# Patient Record
Sex: Female | Born: 1945 | Race: White | Hispanic: No | Smoking: Former smoker
Health system: Southern US, Community
[De-identification: ages and names within clinical notes are randomized; demographics above are authoritative.]

## PROBLEM LIST (undated history)

## (undated) DIAGNOSIS — I10 Essential (primary) hypertension: Secondary | ICD-10-CM

## (undated) DIAGNOSIS — C4492 Squamous cell carcinoma of skin, unspecified: Secondary | ICD-10-CM

## (undated) DIAGNOSIS — E785 Hyperlipidemia, unspecified: Secondary | ICD-10-CM

## (undated) DIAGNOSIS — J449 Chronic obstructive pulmonary disease, unspecified: Secondary | ICD-10-CM

## (undated) DIAGNOSIS — M1990 Unspecified osteoarthritis, unspecified site: Secondary | ICD-10-CM

## (undated) HISTORY — PX: APPENDECTOMY: SHX54

## (undated) HISTORY — PX: OTHER SURGICAL HISTORY: SHX169

## (undated) HISTORY — PX: CHOLECYSTECTOMY: SHX55

## (undated) HISTORY — DX: Hyperlipidemia, unspecified: E78.5

## (undated) HISTORY — PX: TONSILLECTOMY: SUR1361

## (undated) HISTORY — DX: Unspecified osteoarthritis, unspecified site: M19.90

## (undated) HISTORY — DX: Chronic obstructive pulmonary disease, unspecified: J44.9

## (undated) HISTORY — PX: ABDOMINAL HYSTERECTOMY: SHX81

---

## 2004-06-16 ENCOUNTER — Ambulatory Visit: Payer: Self-pay | Admitting: Unknown Physician Specialty

## 2004-10-20 ENCOUNTER — Ambulatory Visit: Payer: Self-pay | Admitting: Unknown Physician Specialty

## 2009-01-08 ENCOUNTER — Other Ambulatory Visit: Payer: Self-pay

## 2009-06-23 ENCOUNTER — Other Ambulatory Visit: Payer: Self-pay

## 2009-08-24 ENCOUNTER — Inpatient Hospital Stay: Payer: Self-pay | Admitting: Internal Medicine

## 2010-01-27 ENCOUNTER — Ambulatory Visit: Payer: Self-pay | Admitting: Urology

## 2010-04-14 ENCOUNTER — Ambulatory Visit: Payer: Self-pay | Admitting: Unknown Physician Specialty

## 2010-05-27 ENCOUNTER — Ambulatory Visit: Payer: Self-pay | Admitting: Gastroenterology

## 2010-06-02 ENCOUNTER — Inpatient Hospital Stay: Payer: Self-pay | Admitting: Internal Medicine

## 2011-04-15 ENCOUNTER — Ambulatory Visit: Payer: Self-pay | Admitting: Internal Medicine

## 2011-09-22 ENCOUNTER — Ambulatory Visit: Payer: Self-pay | Admitting: Internal Medicine

## 2012-06-21 ENCOUNTER — Ambulatory Visit: Payer: Self-pay | Admitting: Internal Medicine

## 2012-07-05 ENCOUNTER — Ambulatory Visit: Payer: Self-pay | Admitting: Internal Medicine

## 2013-07-31 ENCOUNTER — Ambulatory Visit: Payer: Self-pay | Admitting: Internal Medicine

## 2013-09-07 DIAGNOSIS — R739 Hyperglycemia, unspecified: Secondary | ICD-10-CM | POA: Insufficient documentation

## 2013-10-22 DIAGNOSIS — Z8709 Personal history of other diseases of the respiratory system: Secondary | ICD-10-CM | POA: Insufficient documentation

## 2013-10-22 DIAGNOSIS — I10 Essential (primary) hypertension: Secondary | ICD-10-CM | POA: Insufficient documentation

## 2013-10-22 DIAGNOSIS — I480 Paroxysmal atrial fibrillation: Secondary | ICD-10-CM | POA: Insufficient documentation

## 2014-07-01 DIAGNOSIS — R0602 Shortness of breath: Secondary | ICD-10-CM | POA: Insufficient documentation

## 2014-10-02 ENCOUNTER — Other Ambulatory Visit: Payer: Self-pay | Admitting: Internal Medicine

## 2014-10-02 DIAGNOSIS — Z1231 Encounter for screening mammogram for malignant neoplasm of breast: Secondary | ICD-10-CM

## 2014-10-11 ENCOUNTER — Other Ambulatory Visit: Payer: Self-pay | Admitting: Internal Medicine

## 2014-10-11 ENCOUNTER — Ambulatory Visit
Admission: RE | Admit: 2014-10-11 | Discharge: 2014-10-11 | Disposition: A | Discharge status: Home / Self Care | Payer: Medicare Other | Source: Ambulatory Visit | Attending: Internal Medicine | Admitting: Internal Medicine

## 2014-10-11 DIAGNOSIS — Z1231 Encounter for screening mammogram for malignant neoplasm of breast: Secondary | ICD-10-CM

## 2014-12-13 ENCOUNTER — Other Ambulatory Visit: Payer: Self-pay | Admitting: Internal Medicine

## 2014-12-13 DIAGNOSIS — R278 Other lack of coordination: Secondary | ICD-10-CM

## 2014-12-13 DIAGNOSIS — R296 Repeated falls: Secondary | ICD-10-CM

## 2014-12-13 DIAGNOSIS — R279 Unspecified lack of coordination: Secondary | ICD-10-CM

## 2014-12-13 DIAGNOSIS — R270 Ataxia, unspecified: Secondary | ICD-10-CM

## 2014-12-27 ENCOUNTER — Ambulatory Visit
Admission: RE | Admit: 2014-12-27 | Discharge: 2014-12-27 | Disposition: A | Discharge status: Home / Self Care | Payer: Medicare Other | Source: Ambulatory Visit | Attending: Internal Medicine | Admitting: Internal Medicine

## 2014-12-27 DIAGNOSIS — R296 Repeated falls: Secondary | ICD-10-CM | POA: Diagnosis present

## 2014-12-27 DIAGNOSIS — R279 Unspecified lack of coordination: Secondary | ICD-10-CM

## 2014-12-27 DIAGNOSIS — R270 Ataxia, unspecified: Secondary | ICD-10-CM | POA: Insufficient documentation

## 2014-12-27 DIAGNOSIS — R278 Other lack of coordination: Secondary | ICD-10-CM

## 2015-02-25 ENCOUNTER — Other Ambulatory Visit: Payer: Self-pay | Admitting: Internal Medicine

## 2015-02-25 DIAGNOSIS — R0789 Other chest pain: Secondary | ICD-10-CM

## 2015-02-25 DIAGNOSIS — R0602 Shortness of breath: Secondary | ICD-10-CM

## 2015-03-07 ENCOUNTER — Ambulatory Visit: Admission: RE | Admit: 2015-03-07 | Payer: Medicare Other | Source: Ambulatory Visit

## 2015-03-13 ENCOUNTER — Ambulatory Visit
Admission: RE | Admit: 2015-03-13 | Discharge: 2015-03-13 | Disposition: A | Discharge status: Home / Self Care | Payer: Medicare Other | Source: Ambulatory Visit | Attending: Internal Medicine | Admitting: Internal Medicine

## 2015-03-13 DIAGNOSIS — R0602 Shortness of breath: Secondary | ICD-10-CM | POA: Insufficient documentation

## 2015-03-13 DIAGNOSIS — M47814 Spondylosis without myelopathy or radiculopathy, thoracic region: Secondary | ICD-10-CM | POA: Diagnosis not present

## 2015-03-13 DIAGNOSIS — M47812 Spondylosis without myelopathy or radiculopathy, cervical region: Secondary | ICD-10-CM | POA: Insufficient documentation

## 2015-03-13 DIAGNOSIS — R0789 Other chest pain: Secondary | ICD-10-CM | POA: Insufficient documentation

## 2015-03-13 DIAGNOSIS — I517 Cardiomegaly: Secondary | ICD-10-CM | POA: Diagnosis not present

## 2015-03-13 DIAGNOSIS — K449 Diaphragmatic hernia without obstruction or gangrene: Secondary | ICD-10-CM | POA: Insufficient documentation

## 2015-03-13 DIAGNOSIS — I7090 Unspecified atherosclerosis: Secondary | ICD-10-CM | POA: Diagnosis not present

## 2015-03-13 HISTORY — DX: Squamous cell carcinoma of skin, unspecified: C44.92

## 2015-03-13 HISTORY — DX: Essential (primary) hypertension: I10

## 2015-03-13 MED ORDER — IOHEXOL 350 MG/ML SOLN
100.0000 mL | Freq: Once | INTRAVENOUS | Status: AC | PRN
  Administered 2015-03-13: 100 mL via INTRAVENOUS

## 2015-06-16 DIAGNOSIS — J449 Chronic obstructive pulmonary disease, unspecified: Secondary | ICD-10-CM | POA: Insufficient documentation

## 2015-06-16 DIAGNOSIS — M1990 Unspecified osteoarthritis, unspecified site: Secondary | ICD-10-CM | POA: Insufficient documentation

## 2015-10-23 DIAGNOSIS — M159 Polyosteoarthritis, unspecified: Secondary | ICD-10-CM | POA: Insufficient documentation

## 2015-10-23 DIAGNOSIS — R768 Other specified abnormal immunological findings in serum: Secondary | ICD-10-CM | POA: Insufficient documentation

## 2015-11-28 ENCOUNTER — Other Ambulatory Visit: Payer: Self-pay | Admitting: Internal Medicine

## 2015-11-28 DIAGNOSIS — Z1231 Encounter for screening mammogram for malignant neoplasm of breast: Secondary | ICD-10-CM

## 2015-12-05 ENCOUNTER — Ambulatory Visit
Admission: RE | Admit: 2015-12-05 | Discharge: 2015-12-05 | Disposition: A | Discharge status: Home / Self Care | Payer: Medicare Other | Source: Ambulatory Visit | Attending: Internal Medicine | Admitting: Internal Medicine

## 2015-12-05 DIAGNOSIS — Z1231 Encounter for screening mammogram for malignant neoplasm of breast: Secondary | ICD-10-CM | POA: Diagnosis not present

## 2016-08-10 ENCOUNTER — Ambulatory Visit (INDEPENDENT_AMBULATORY_CARE_PROVIDER_SITE_OTHER): Payer: Medicare Other | Admitting: Vascular Surgery

## 2016-08-10 ENCOUNTER — Encounter (INDEPENDENT_AMBULATORY_CARE_PROVIDER_SITE_OTHER): Payer: Self-pay | Admitting: Vascular Surgery

## 2016-08-10 VITALS — BP 144/72 | HR 64 | Resp 15 | Ht 66.0 in | Wt 186.0 lb

## 2016-08-10 DIAGNOSIS — M79605 Pain in left leg: Secondary | ICD-10-CM

## 2016-08-10 DIAGNOSIS — M79604 Pain in right leg: Secondary | ICD-10-CM | POA: Diagnosis not present

## 2016-08-10 DIAGNOSIS — E785 Hyperlipidemia, unspecified: Secondary | ICD-10-CM

## 2016-08-10 DIAGNOSIS — R29898 Other symptoms and signs involving the musculoskeletal system: Secondary | ICD-10-CM | POA: Diagnosis not present

## 2016-08-10 DIAGNOSIS — Z720 Tobacco use: Secondary | ICD-10-CM | POA: Diagnosis not present

## 2016-08-10 DIAGNOSIS — I10 Essential (primary) hypertension: Secondary | ICD-10-CM | POA: Diagnosis not present

## 2016-08-10 NOTE — Progress Notes (Signed)
Subjective:    Patient ID: Erin Trujillo, female    DOB: 1945-02-15, 71 y.o.   MRN: [id number] Chief Complaint  Patient presents with  . New Evaluation    Painful varicose veins   Presents as a new patient referred by Dr. Doy Hutching for lower extremity pain. 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 About 10 years ago, the patient had a "procedure" on" L2-L3". States she had "cysts removed". Patient states she has "sciatica". Patient states she has "rheumatoid arthritis". Patient states she is "bone on bone" to her right knee. Patient has also had a Baker's cyst "burst" to the back of her right knee. Patient endorses a history of having what sounds like an ABI completed in the 90s which showed "some blockage". Patient endorses a history of long-standing bilateral lower extremity pain. She is unable to walk long distances without having to stop due to pain. States the pain radiates distally bilaterally. Patient is also experiencing weakness to her bilateral extremities. Stating sometimes it is hard to lift herself out of bed or chair. Patient also is experiencing bilateral lower extremity edema, which worsens throughout the day. He feels this edema worsens her already bilateral lower extremity pain. She denies any rest pain or ulceration to her lower extremities. The patient continues to work about 4-5 days a month. Her discomfort has worsened to the point it is interfering with her ability to function on a daily basis. The patient smokes about 4-5 cigarettes a week.    Review of Systems  HENT: Negative.   Eyes: Negative.   Respiratory: Negative.   Cardiovascular: Positive for leg swelling.       Lower extremity pain  Gastrointestinal: Negative.   Endocrine: Negative.   Genitourinary: Negative.   Musculoskeletal: Negative.   Skin: Negative.   Allergic/Immunologic: Negative.   Neurological: Positive for weakness.  Hematological: Negative.     Psychiatric/Behavioral: Negative.       Objective:   Physical Exam  Constitutional: She is oriented to person, place, and time. She appears well-developed. No distress.  HENT:  Head: Normocephalic and atraumatic.  Eyes: Conjunctivae are normal. Pupils are equal, round, and reactive to light.  Neck: Normal range of motion.  No carotid bruits noted  Cardiovascular: Normal rate, regular rhythm, normal heart sounds and intact distal pulses.   Pulses:      Radial pulses are 2+ on the right side, and 2+ on the left side.       Dorsalis pedis pulses are 2+ on the right side, and 2+ on the left side.  Pulmonary/Chest: Effort normal.  Musculoskeletal: Normal range of motion. She exhibits no edema (Mild bilateral edema).  Neurological: She is alert and oriented to person, place, and time.  Skin: Skin is warm and dry. She is not diaphoretic.  Scattered spider veins bilaterally Less than 1 cm varicose veins noted bilaterally  Psychiatric: She has a normal mood and affect. Her behavior is normal. Judgment and thought content normal.  Vitals reviewed.  BP (!) 144/72 (BP Location: Right Arm)   Pulse 64   Resp 15   Ht 5\' 6"  (1.676 m)   Wt 186 lb (84.4 kg)   BMI 30.02 kg/m   Past Medical History:  Diagnosis Date  . Arthritis   . COPD (chronic obstructive pulmonary disease) ([place])   . Hyperlipidemia   . Hypertension   . Squamous cell skin cancer    Social History   Social  History  . Marital status: Divorced    Spouse name: N/A  . Number of children: N/A  . Years of education: N/A   Occupational History  . Not on file.   Social History Main Topics  . Smoking status: Never Smoker  . Smokeless tobacco: Never Used  . Alcohol use No  . Drug use: Unknown  . Sexual activity: Not on file   Other Topics Concern  . Not on file   Social History Narrative  . No narrative on file   Past Surgical History:  Procedure Laterality Date  . APPENDECTOMY    . spine cyst removal    .  TONSILLECTOMY     Family History  Problem Relation Age of Onset  . Arthritis Mother   . Anxiety disorder Mother   . Osteoporosis Mother   . Hypertension Father   . Stroke Father   . Cancer Son   . Breast cancer Neg Hx    Allergies  Allergen Reactions  . Ciprofloxacin Nausea Only      Assessment & Plan:  Presents as a new patient referred by Dr. Doy Hutching for lower extremity pain. 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 About 10 years ago, the patient had a "procedure" on" L2-L3". States she had "cysts removed". Patient states she has "sciatica". Patient states she has "rheumatoid arthritis". Patient states she is "bone on bone" to her right knee. Patient has also had a Baker's cyst "burst" to the back of her right knee. Patient endorses a history of having what sounds like an ABI completed in the 90s which showed "some blockage". Patient endorses a history of long-standing bilateral lower extremity pain. She is unable to walk long distances without having to stop due to pain. States the pain radiates distally bilaterally. Patient is also experiencing weakness to her bilateral extremities. Stating sometimes it is hard to lift herself out of bed or chair. Patient also is experiencing bilateral lower extremity edema, which worsens throughout the day. He feels this edema worsens her already bilateral lower extremity pain. She denies any rest pain or ulceration to her lower extremities. The patient continues to work about 4-5 days a month. Her discomfort has worsened to the point it is interfering with her ability to function on a daily basis. The patient smokes about 4-5 cigarettes a week.   1. Lower extremity pain, bilateral - New Patient with long-standing history of bilateral lower extremity pain Patient with an extensive history of degenerative joint disease of the spine and knee. I strongly feel that this is most likely the cause of her  lower extremity pain however I will order an ABI to rule out any peripheral artery disease that may be contributing. Patient is also experiencing bilateral lower extremity edema which contributes to her discomfort. The patient was encouraged to wear graduated compression stockings (20-30 mmHg) on a daily basis. The patient was instructed to begin wearing the stockings first thing in the morning and removing them in the evening. The patient was instructed specifically not to sleep in the stockings. Prescription given In addition, behavioral modification including elevation during the day will be initiated. Anti-inflammatories for pain.  - VAS [place] ABI WITH/WO TBI; Future - VAS [place] LOWER EXTREMITY VENOUS REFLUX; Future  2. Weakness of both lower extremities - new Patient with bilateral lower extremity weakness. I strongly feel that this is coming from her extensive degenerative joint history of the spine and knees. I will  order a carotid duplex to rule out any carotid stenosis that may be contributing  - VAS US CAROTID; Future  3. Essential hypertension - stable Encouraged good control as its slows the progression of atherosclerotic disease  4. Hyperlipidemia, unspecified hyperlipidemia type - stable Encouraged good control as its slows the progression of atherosclerotic disease  5. Tobacco abuse - stable I have discussed (approximately 5 minutes) with the patient the role of tobacco in the pathogenesis of atherosclerosis and its effect on the progression of the disease, impact on the durability of interventions and its limitations on the formation of collateral pathways. I have recommended absolute tobacco cessation. I have discussed various options available for assistance with tobacco cessation including over the counter methods (Nicotine gum, patch and lozenges). We also discussed prescription options (Chantix, Nicotine Inhaler / Nasal Spray). The patient is not interested in pursuing any  prescription tobacco cessation options at this time. The patient voices their understanding.   No current outpatient prescriptions on file prior to visit.   No current facility-administered medications on file prior to visit.     There are no Patient Instructions on file for this visit. No Follow-up on file.   KIMBERLY A STEGMAYER, PA-C

## 2016-10-05 ENCOUNTER — Ambulatory Visit (INDEPENDENT_AMBULATORY_CARE_PROVIDER_SITE_OTHER): Payer: Medicare Other

## 2016-10-05 ENCOUNTER — Encounter (INDEPENDENT_AMBULATORY_CARE_PROVIDER_SITE_OTHER): Payer: Self-pay | Admitting: Vascular Surgery

## 2016-10-05 ENCOUNTER — Ambulatory Visit (INDEPENDENT_AMBULATORY_CARE_PROVIDER_SITE_OTHER): Payer: Medicare Other | Admitting: Vascular Surgery

## 2016-10-05 VITALS — BP 135/63 | HR 71 | Resp 16 | Wt 190.0 lb

## 2016-10-05 DIAGNOSIS — Z720 Tobacco use: Secondary | ICD-10-CM

## 2016-10-05 DIAGNOSIS — E785 Hyperlipidemia, unspecified: Secondary | ICD-10-CM | POA: Diagnosis not present

## 2016-10-05 DIAGNOSIS — M79604 Pain in right leg: Secondary | ICD-10-CM | POA: Diagnosis not present

## 2016-10-05 DIAGNOSIS — M79605 Pain in left leg: Secondary | ICD-10-CM

## 2016-10-05 DIAGNOSIS — I70212 Atherosclerosis of native arteries of extremities with intermittent claudication, left leg: Secondary | ICD-10-CM | POA: Diagnosis not present

## 2016-10-05 DIAGNOSIS — I10 Essential (primary) hypertension: Secondary | ICD-10-CM | POA: Diagnosis not present

## 2016-10-05 DIAGNOSIS — I6523 Occlusion and stenosis of bilateral carotid arteries: Secondary | ICD-10-CM

## 2016-10-05 DIAGNOSIS — R29898 Other symptoms and signs involving the musculoskeletal system: Secondary | ICD-10-CM

## 2016-10-05 DIAGNOSIS — F17210 Nicotine dependence, cigarettes, uncomplicated: Secondary | ICD-10-CM

## 2016-10-05 NOTE — Progress Notes (Signed)
MRN : [id number]  Erin Trujillo is a 71 y.o. (07/08/1945) female who presents with chief complaint of  Chief Complaint  Patient presents with  . ultrasound follow up  .  History of Present Illness: Patient returns today in follow up of multiple vascular issues.  She continues to have claudication symptoms worse on the left leg than the right.  No rest pain.  No tissue loss.  Her ABIs were 0.98 on the right and 0.75 on the left with good waveforms on the right and fair waveforms on the left.   She is also studied with carotid duplex today.  She has not had focal neurologic symptoms.  Specifically, the patient denies amaurosis fugax, speech or swallowing difficulties, or arm or leg weakness or numbness.  Carotid duplex shows 1-39% bilateral carotid artery stenosis without progression from a study two years ago.  Current Outpatient Prescriptions  Medication Sig Dispense Refill  . acetaminophen (TYLENOL) 500 MG tablet Take 500 mg by mouth.    . Biotin 5 MG CAPS Take 5 mg by mouth.    Marland Kitchen buPROPion (WELLBUTRIN XL) 300 MG 24 hr tablet Take 300 mg by mouth.    . Ca Phosphate-Cholecalciferol (CALCIUM/VITAMIN D3 GUMMIES PO) Take by mouth.    . diclofenac sodium (VOLTAREN) 1 % GEL Apply topically.    Marland Kitchen ELIQUIS 5 MG TABS tablet   4  . escitalopram (LEXAPRO) 10 MG tablet Take 10 mg by mouth.    . losartan-hydrochlorothiazide (HYZAAR) 100-12.5 MG tablet Take by mouth.    . metoprolol succinate (TOPROL-XL) 50 MG 24 hr tablet Take by mouth.    . Multiple Vitamin (MULTIVITAMIN) capsule Take by mouth.    . simvastatin (ZOCOR) 40 MG tablet   0  . vitamin B-12 (CYANOCOBALAMIN) 1000 MCG tablet Take by mouth.     No current facility-administered medications for this visit.     Past Medical History:  Diagnosis Date  . Arthritis   . COPD (chronic obstructive pulmonary disease) ([place])   . Hyperlipidemia   . Hypertension   . Squamous cell skin cancer     Past Surgical History:  Procedure Laterality  Date  . APPENDECTOMY    . spine cyst removal    . TONSILLECTOMY      Social History Ongoing tobacco use, <1PPD No ETOH No IVDU  Family History Family History  Problem Relation Age of Onset  . Arthritis Mother   . Anxiety disorder Mother   . Osteoporosis Mother   . Hypertension Father   . Stroke Father   . Cancer Son   . Breast cancer Neg Hx     Allergies  Allergen Reactions  . Ciprofloxacin Nausea Only     REVIEW OF SYSTEMS (Negative unless checked)  Constitutional: [] Weight loss  [] Fever  [] Chills Cardiac: [] Chest pain   [] Chest pressure   [] Palpitations   [] Shortness of breath when laying flat   [] Shortness of breath at rest   [] Shortness of breath with exertion. Vascular:  [x] Pain in legs with walking   [] Pain in legs at rest   [] Pain in legs when laying flat   [] Claudication   [] Pain in feet when walking  [] Pain in feet at rest  [] Pain in feet when laying flat   [] History of DVT   [] Phlebitis   [] Swelling in legs   [] Varicose veins   [] Non-healing ulcers Pulmonary:   [] Uses home oxygen   [] Productive cough   [] Hemoptysis   [] Wheeze  [] COPD   [] Asthma  Neurologic:  [] Dizziness  [] Blackouts   [] Seizures   [] History of stroke   [] History of TIA  [] Aphasia   [] Temporary blindness   [] Dysphagia   [] Weakness or numbness in arms   [] Weakness or numbness in legs Musculoskeletal:  [x] Arthritis   [] Joint swelling   [] Joint pain   [] Low back pain Hematologic:  [x] Easy bruising  [] Easy bleeding   [] Hypercoagulable state   [] Anemic   Gastrointestinal:  [] Blood in stool   [] Vomiting blood  [] Gastroesophageal reflux/heartburn   [] Abdominal pain Genitourinary:  [] Chronic kidney disease   [] Difficult urination  [] Frequent urination  [] Burning with urination   [] Hematuria Skin:  [] Rashes   [] Ulcers   [] Wounds Psychological:  [] History of anxiety   []  History of major depression.  Physical Examination  BP 135/63   Pulse 71   Resp 16   Wt 86.2 kg (190 lb)   BMI 30.67 kg/m  Gen:   WD/WN, NAD Head: [place]/AT, No temporalis wasting. Ear/Nose/Throat: Hearing grossly intact, nares w/o erythema or drainage, trachea midline Eyes: Conjunctiva clear. Sclera non-icteric Neck: Supple.  No JVD.  Pulmonary:  Good air movement, no use of accessory muscles.  Cardiac: RRR, normal S1, S2 Vascular:  Vessel Right Left  Radial Palpable Palpable                          PT Palpable 1+ Palpable  DP 1+ Palpable 1+ Palpable    Musculoskeletal: M/S 5/5 throughout.  No deformity or atrophy. No LE edema. Neurologic: Sensation grossly intact in extremities.  Symmetrical.  Speech is fluent.  Psychiatric: Judgment intact, Mood & affect appropriate for pt's clinical situation. Dermatologic: No rashes or ulcers noted.  No cellulitis or open wounds.       Labs No results found for this or any previous visit (from the past 2160 hour(s)).  Radiology No results found.    Assessment/Plan  Essential hypertension blood pressure control important in reducing the progression of atherosclerotic disease. On appropriate oral medications.   Hyperlipidemia lipid control important in reducing the progression of atherosclerotic disease. Continue statin therapy   Tobacco abuse We had a discussion for approximately 3 minutes regarding the absolute need for smoking cessation due to the deleterious nature of tobacco on the vascular system. We discussed the tobacco use would diminish patency of any intervention, and likely significantly worsen progressio of disease. We discussed multiple agents for quitting including replacement therapy or medications to reduce cravings such as Chantix. The patient voices their understanding of the importance of smoking cessation.   Carotid atherosclerosis, bilateral  Carotid duplex shows 1-39% bilateral carotid arterystenosis without progression from a study two years ago. She has atherosclerosis without significant stenosis.  Continue current medical regimen  and recheck in two to three years.   Atherosclerosis of native arteries of extremity with intermittent claudication (HCC) Her ABIs were 0.98 on the right and 0.75 on the left with good waveforms on the right and fair waveforms on the left.   We had a discussion today regarding treatment options.  I have discussed angiography with possible intervention versus lifestyle modification with increased exercise and smoking cessation as well as lipid and BP control.  She would prefer the latter which is reasonable given the non-limb threatening nature of her disease.  I will recheck her ABIs in one year.    Leotis Pain, MD  10/07/2016 5:37 PM    This note was created with Dragon medical transcription system.  Any errors  from dictation are purely unintentional

## 2016-10-07 DIAGNOSIS — I70219 Atherosclerosis of native arteries of extremities with intermittent claudication, unspecified extremity: Secondary | ICD-10-CM | POA: Insufficient documentation

## 2016-10-07 DIAGNOSIS — I6523 Occlusion and stenosis of bilateral carotid arteries: Secondary | ICD-10-CM | POA: Insufficient documentation

## 2016-10-07 NOTE — Patient Instructions (Signed)

## 2016-10-07 NOTE — Assessment & Plan Note (Signed)
Her ABIs were 0.98 on the right and 0.75 on the left with good waveforms on the right and fair waveforms on the left.   We had a discussion today regarding treatment options.  I have discussed angiography with possible intervention versus lifestyle modification with increased exercise and smoking cessation as well as lipid and BP control.  She would prefer the latter which is reasonable given the non-limb threatening nature of her disease.  I will recheck her ABIs in one year.

## 2016-10-07 NOTE — Assessment & Plan Note (Signed)
lipid control important in reducing the progression of atherosclerotic disease. Continue statin therapy  

## 2016-10-07 NOTE — Assessment & Plan Note (Signed)
Carotid duplex shows 1-39% bilateral carotid arterystenosis without progression from a study two years ago. She has atherosclerosis without significant stenosis.  Continue current medical regimen and recheck in two to three years.

## 2016-10-07 NOTE — Assessment & Plan Note (Signed)
blood pressure control important in reducing the progression of atherosclerotic disease. On appropriate oral medications.  

## 2016-10-07 NOTE — Assessment & Plan Note (Signed)

## 2016-11-23 ENCOUNTER — Encounter (INDEPENDENT_AMBULATORY_CARE_PROVIDER_SITE_OTHER): Payer: Medicare Other

## 2016-11-23 ENCOUNTER — Ambulatory Visit (INDEPENDENT_AMBULATORY_CARE_PROVIDER_SITE_OTHER): Payer: Medicare Other | Admitting: Vascular Surgery

## 2016-12-03 ENCOUNTER — Other Ambulatory Visit: Payer: Self-pay | Admitting: Neurology

## 2016-12-03 DIAGNOSIS — R2689 Other abnormalities of gait and mobility: Secondary | ICD-10-CM

## 2016-12-08 ENCOUNTER — Ambulatory Visit
Admission: RE | Admit: 2016-12-08 | Discharge: 2016-12-08 | Disposition: A | Discharge status: Home / Self Care | Payer: Medicare Other | Source: Ambulatory Visit | Attending: Neurology | Admitting: Neurology

## 2016-12-08 DIAGNOSIS — R2689 Other abnormalities of gait and mobility: Secondary | ICD-10-CM | POA: Diagnosis present

## 2016-12-08 DIAGNOSIS — M4802 Spinal stenosis, cervical region: Secondary | ICD-10-CM | POA: Diagnosis not present

## 2016-12-08 DIAGNOSIS — M5021 Other cervical disc displacement,  high cervical region: Secondary | ICD-10-CM | POA: Diagnosis not present

## 2016-12-08 DIAGNOSIS — M4804 Spinal stenosis, thoracic region: Secondary | ICD-10-CM | POA: Diagnosis not present

## 2016-12-08 DIAGNOSIS — M5030 Other cervical disc degeneration, unspecified cervical region: Secondary | ICD-10-CM | POA: Diagnosis not present

## 2016-12-08 DIAGNOSIS — M2578 Osteophyte, vertebrae: Secondary | ICD-10-CM | POA: Diagnosis not present

## 2016-12-08 DIAGNOSIS — M5134 Other intervertebral disc degeneration, thoracic region: Secondary | ICD-10-CM | POA: Diagnosis not present

## 2017-05-20 ENCOUNTER — Other Ambulatory Visit: Payer: Self-pay | Admitting: Internal Medicine

## 2017-05-20 DIAGNOSIS — Z1231 Encounter for screening mammogram for malignant neoplasm of breast: Secondary | ICD-10-CM

## 2017-06-09 ENCOUNTER — Ambulatory Visit
Admission: RE | Admit: 2017-06-09 | Discharge: 2017-06-09 | Disposition: A | Discharge status: Home / Self Care | Payer: Medicare Other | Source: Ambulatory Visit | Attending: Internal Medicine | Admitting: Internal Medicine

## 2017-06-09 DIAGNOSIS — Z1231 Encounter for screening mammogram for malignant neoplasm of breast: Secondary | ICD-10-CM | POA: Insufficient documentation

## 2017-10-05 ENCOUNTER — Encounter (INDEPENDENT_AMBULATORY_CARE_PROVIDER_SITE_OTHER): Payer: Medicare Other

## 2017-10-05 ENCOUNTER — Ambulatory Visit (INDEPENDENT_AMBULATORY_CARE_PROVIDER_SITE_OTHER): Payer: Medicare Other | Admitting: Vascular Surgery

## 2017-10-05 ENCOUNTER — Encounter (INDEPENDENT_AMBULATORY_CARE_PROVIDER_SITE_OTHER): Payer: Self-pay

## 2018-09-22 DIAGNOSIS — F32A Depression, unspecified: Secondary | ICD-10-CM | POA: Insufficient documentation

## 2018-09-25 ENCOUNTER — Other Ambulatory Visit: Payer: Self-pay | Admitting: Internal Medicine

## 2018-09-25 DIAGNOSIS — G44319 Acute post-traumatic headache, not intractable: Secondary | ICD-10-CM

## 2018-10-03 ENCOUNTER — Ambulatory Visit: Payer: Medicare Other

## 2018-10-09 ENCOUNTER — Other Ambulatory Visit: Payer: Self-pay

## 2018-10-09 ENCOUNTER — Ambulatory Visit
Admission: RE | Admit: 2018-10-09 | Discharge: 2018-10-09 | Disposition: A | Discharge status: Home / Self Care | Payer: Medicare Other | Source: Ambulatory Visit | Attending: Internal Medicine | Admitting: Internal Medicine

## 2018-10-09 DIAGNOSIS — G44319 Acute post-traumatic headache, not intractable: Secondary | ICD-10-CM | POA: Diagnosis not present

## 2019-03-14 ENCOUNTER — Other Ambulatory Visit: Payer: Self-pay | Admitting: Internal Medicine

## 2019-03-14 DIAGNOSIS — Z1231 Encounter for screening mammogram for malignant neoplasm of breast: Secondary | ICD-10-CM

## 2019-03-22 ENCOUNTER — Ambulatory Visit: Payer: Medicare Other | Attending: Internal Medicine

## 2019-03-22 DIAGNOSIS — Z23 Encounter for immunization: Secondary | ICD-10-CM

## 2019-03-22 NOTE — Progress Notes (Signed)
   Covid-19 Vaccination Clinic  Name:  Erin Trujillo    MRN: [id number] DOB: 01-Jan-1946  03/22/2019  Ms. [name] was observed post Covid-19 immunization for 15 minutes without incidence. She was provided with Vaccine Information Sheet and instruction to access the V-Safe system.   Ms. [name] was instructed to call 911 with any severe reactions post vaccine: Marland Kitchen Difficulty breathing  . Swelling of your face and throat  . A fast heartbeat  . A bad rash all over your body  . Dizziness and weakness    Immunizations Administered    Name Date Dose VIS Date Route   Pfizer COVID-19 Vaccine 03/22/2019 11:40 AM 0.3 mL 01/19/2019 Intramuscular   Manufacturer: [place]   Lot: [id number]   [place]: [id number]

## 2019-04-17 ENCOUNTER — Ambulatory Visit: Payer: Medicare Other | Attending: Internal Medicine

## 2019-04-17 DIAGNOSIS — Z23 Encounter for immunization: Secondary | ICD-10-CM | POA: Insufficient documentation

## 2019-04-17 NOTE — Progress Notes (Signed)
   Covid-19 Vaccination Clinic  Name:  NARMEEN TENBRINK    MRN: [id number] DOB: [birth year]  04/17/2019  Ms. Sohn was observed post Covid-19 immunization for 15 minutes without incident. She was provided with Vaccine Information Sheet and instruction to access the V-Safe system.   Ms. Kilcrease was instructed to call 911 with any severe reactions post vaccine: Marland Kitchen Difficulty breathing  . Swelling of face and throat  . A fast heartbeat  . A bad rash all over body  . Dizziness and weakness   Immunizations Administered    Name Date Dose VIS Date Route   Pfizer COVID-19 Vaccine 04/17/2019 11:16 AM 0.3 mL 01/19/2019 Intramuscular   Manufacturer: [place]   Lot: [id number]   [place]: [id number]

## 2019-06-22 ENCOUNTER — Telehealth (INDEPENDENT_AMBULATORY_CARE_PROVIDER_SITE_OTHER): Payer: Self-pay | Admitting: Vascular Surgery

## 2019-06-22 NOTE — Telephone Encounter (Signed)
Bring her in with ABIs, however she has to seen before 10/06/19, otherwise she'll need a referral because it will be over three years. Me or Dew

## 2019-06-22 NOTE — Telephone Encounter (Signed)
Called patient back to schedule and lvm to that we need to schedule before 10/06/19.

## 2019-07-04 ENCOUNTER — Other Ambulatory Visit (INDEPENDENT_AMBULATORY_CARE_PROVIDER_SITE_OTHER): Payer: Self-pay | Admitting: Nurse Practitioner

## 2019-07-04 DIAGNOSIS — R200 Anesthesia of skin: Secondary | ICD-10-CM

## 2019-07-06 ENCOUNTER — Ambulatory Visit (INDEPENDENT_AMBULATORY_CARE_PROVIDER_SITE_OTHER): Payer: Medicare Other | Admitting: Nurse Practitioner

## 2019-07-06 ENCOUNTER — Encounter (INDEPENDENT_AMBULATORY_CARE_PROVIDER_SITE_OTHER): Payer: Medicare Other

## 2019-08-01 ENCOUNTER — Ambulatory Visit (INDEPENDENT_AMBULATORY_CARE_PROVIDER_SITE_OTHER): Payer: Medicare Other | Admitting: Nurse Practitioner

## 2019-08-01 ENCOUNTER — Ambulatory Visit (INDEPENDENT_AMBULATORY_CARE_PROVIDER_SITE_OTHER): Payer: Medicare Other

## 2019-08-01 ENCOUNTER — Encounter (INDEPENDENT_AMBULATORY_CARE_PROVIDER_SITE_OTHER): Payer: Self-pay | Admitting: Nurse Practitioner

## 2019-08-01 ENCOUNTER — Other Ambulatory Visit: Payer: Self-pay

## 2019-08-01 VITALS — BP 153/76 | HR 64 | Resp 16 | Wt 177.6 lb

## 2019-08-01 DIAGNOSIS — R200 Anesthesia of skin: Secondary | ICD-10-CM | POA: Diagnosis not present

## 2019-08-01 DIAGNOSIS — I10 Essential (primary) hypertension: Secondary | ICD-10-CM

## 2019-08-01 DIAGNOSIS — E785 Hyperlipidemia, unspecified: Secondary | ICD-10-CM

## 2019-08-01 DIAGNOSIS — I70223 Atherosclerosis of native arteries of extremities with rest pain, bilateral legs: Secondary | ICD-10-CM | POA: Diagnosis not present

## 2019-08-03 ENCOUNTER — Telehealth (INDEPENDENT_AMBULATORY_CARE_PROVIDER_SITE_OTHER): Payer: Self-pay

## 2019-08-03 NOTE — Telephone Encounter (Signed)
Spoke with the patient and she is scheduled with Dr. Lucky Cowboy for a  right leg angio on 08/09/19 with a 7:45 am arrival time to the [facility]. Patient will do covid testing on 08/07/19 between 8-1 pm at the [place] [facility]. Patient is also scheduled for a left leg angio on 08/16/19 with a 6:45 am arrival time to the [facility]. Pre-procedure instructions were discussed and will be mailed.

## 2019-08-05 ENCOUNTER — Encounter (INDEPENDENT_AMBULATORY_CARE_PROVIDER_SITE_OTHER): Payer: Self-pay | Admitting: Nurse Practitioner

## 2019-08-05 NOTE — Progress Notes (Signed)
Subjective:    Patient ID: Erin Trujillo, female    DOB: [birth year], 74 y.o.   MRN: [id number] Chief Complaint  Patient presents with  . Follow-up    per note     The patient returns to the office for followup and review of the noninvasive studies. There has been a significant deterioration in the lower extremity symptoms.  The patient notes interval shortening of their claudication distance and development of mild rest pain symptoms. No new ulcers or wounds have occurred since the last visit.  The patient has also been having significant lower extremity weakness recently.  The patient has had multiple lower extremity falls.  During quarantine during COVID-19 the patient did not leave her home and exercise very little.  There is some thought that this may also be due to deconditioning however the patient is unable to do physical therapy with concerns about falling and hurting herself.  The patient most recently had a fall and hit her face which has given her significant anxiety about exercising or walking alone.  There have been no significant changes to the patient's overall health care.  The patient denies amaurosis fugax or recent TIA symptoms. There are no recent neurological changes noted. The patient denies history of DVT, PE or superficial thrombophlebitis. The patient denies recent episodes of angina or shortness of breath.   ABI's Rt=0.66 and Lt=0.54 (previous ABI's Rt=0.98 and Lt=0.75) Duplex US of the lower extremity arterial system shows monophasic waveforms in the bilateral tibial arteries.  Further examination utilizing Doppler waveforms suggest occlusive disease at the aortic iliac level.  The right toe waveforms are severely dampened and nearly flat whereas the left digit has dampened toe waveforms.   Review of Systems  Cardiovascular:       Claudication and rest pain  Musculoskeletal: Positive for gait problem.  Neurological: Positive for weakness.       Objective:     Physical Exam Vitals reviewed.  HENT:     Head: Normocephalic.  Cardiovascular:     Rate and Rhythm: Normal rate and regular rhythm.     Pulses: Decreased pulses.  Pulmonary:     Effort: Pulmonary effort is normal.     Breath sounds: Normal breath sounds.  Neurological:     Mental Status: She is alert and oriented to person, place, and time.     Motor: Weakness present.     Gait: Gait abnormal.  Psychiatric:        Mood and Affect: Mood normal.        Behavior: Behavior normal.        Thought Content: Thought content normal.        Judgment: Judgment normal.     BP (!) 153/76 (BP Location: Right Arm)   Pulse 64   Resp 16   Wt 177 lb 9.6 oz (80.6 kg)   BMI 28.67 kg/m   Past Medical History:  Diagnosis Date  . Arthritis   . COPD (chronic obstructive pulmonary disease) ([place])   . Hyperlipidemia   . Hypertension   . Squamous cell skin cancer     Social History   Socioeconomic History  . Marital status: Divorced    Spouse name: Not on file  . Number of children: Not on file  . Years of education: Not on file  . Highest education level: Not on file  Occupational History  . Not on file  Tobacco Use  . Smoking status: Never Smoker  . Smokeless tobacco: Never  Used  Substance and Sexual Activity  . Alcohol use: No  . Drug use: Not on file  . Sexual activity: Not on file  Other Topics Concern  . Not on file  Social History Narrative  . Not on file   Social Determinants of Health   Financial Resource Strain:   . Difficulty of Paying Living Expenses:   Food Insecurity:   . Worried About Charity fundraiser in the Last Year:   . Arboriculturist in the Last Year:   Transportation Needs:   . Film/video editor (Medical):   Marland Kitchen Lack of Transportation (Non-Medical):   Physical Activity:   . Days of Exercise per Week:   . Minutes of Exercise per Session:   Stress:   . Feeling of Stress :   Social Connections:   . Frequency of Communication with Friends and  Family:   . Frequency of Social Gatherings with Friends and Family:   . Attends Religious Services:   . Active Member of Clubs or Organizations:   . Attends Archivist Meetings:   Marland Kitchen Marital Status:   Intimate Partner Violence:   . Fear of Current or Ex-Partner:   . Emotionally Abused:   Marland Kitchen Physically Abused:   . Sexually Abused:     Past Surgical History:  Procedure Laterality Date  . APPENDECTOMY    . spine cyst removal    . TONSILLECTOMY      Family History  Problem Relation Age of Onset  . Arthritis Mother   . Anxiety disorder Mother   . Osteoporosis Mother   . Hypertension Father   . Stroke Father   . Cancer Son   . Breast cancer Neg Hx     Allergies  Allergen Reactions  . Ciprofloxacin Nausea Only       Assessment & Plan:   1. Atherosclerosis of native artery of both lower extremities with rest pain ([place]) Recommend:  The patient has evidence of severe atherosclerotic changes of both lower extremities with rest pain that is associated with preulcerative changes and impending tissue loss of the foot.  I discussed with patient and family that her severe atherosclerotic changes may certainly be contributing to her weakness, and treatment may help regain some strength however physical therapy may also still be needed.  There is also the possibility that due to the aortic iliac level disease, this could be a possible precursor to more extensive intervention.  Patient should undergo angiography of the bilateral lower extremities..  The risks and benefits as well as the alternative therapies was discussed in detail with the patient.  All questions were answered.  Patient agrees to proceed with angiography.  The patient will follow up with me in the office after the procedure.       2. Hyperlipidemia, unspecified hyperlipidemia type Continue statin as ordered and reviewed, no changes at this time   3. Essential hypertension Continue antihypertensive  medications as already ordered, these medications have been reviewed and there are no changes at this time.    Current Outpatient Medications on File Prior to Visit  Medication Sig Dispense Refill  . acetaminophen (TYLENOL) 500 MG tablet Take 500 mg by mouth.    Marland Kitchen amLODipine (NORVASC) 5 MG tablet Take 5 mg by mouth daily.    . diclofenac sodium (VOLTAREN) 1 % GEL Apply topically.    . DULoxetine (CYMBALTA) 60 MG capsule Take 60 mg by mouth daily.    Marland Kitchen ELIQUIS 5 MG TABS  tablet 5 mg 2 (two) times daily.   4  . losartan-hydrochlorothiazide (HYZAAR) 100-12.5 MG tablet Take by mouth.    . metoprolol succinate (TOPROL-XL) 50 MG 24 hr tablet Take by mouth.    . Multiple Vitamin (MULTIVITAMIN) capsule Take by mouth.    . simvastatin (ZOCOR) 40 MG tablet   0  . triamcinolone ointment (KENALOG) 0.1 % Apply 1 application topically 2 (two) times daily as needed.     . Biotin 5 MG CAPS Take 5 mg by mouth. (Patient not taking: Reported on 08/01/2019)    . buPROPion (WELLBUTRIN XL) 300 MG 24 hr tablet Take 300 mg by mouth. (Patient not taking: Reported on 08/01/2019)    . Ca Phosphate-Cholecalciferol (CALCIUM/VITAMIN D3 GUMMIES PO) Take by mouth. (Patient not taking: Reported on 08/01/2019)    . escitalopram (LEXAPRO) 10 MG tablet Take 10 mg by mouth. (Patient not taking: Reported on 08/01/2019)    . primidone (MYSOLINE) 50 MG tablet Take by mouth. (Patient not taking: Reported on 08/01/2019)    . vitamin B-12 (CYANOCOBALAMIN) 1000 MCG tablet Take by mouth. (Patient not taking: Reported on 08/01/2019)     No current facility-administered medications on file prior to visit.    There are no Patient Instructions on file for this visit. No follow-ups on file.   Kris Hartmann, NP

## 2019-08-07 ENCOUNTER — Other Ambulatory Visit
Admission: RE | Admit: 2019-08-07 | Discharge: 2019-08-07 | Disposition: A | Discharge status: Home / Self Care | Payer: Medicare Other | Source: Ambulatory Visit | Attending: Vascular Surgery | Admitting: Vascular Surgery

## 2019-08-07 ENCOUNTER — Other Ambulatory Visit (INDEPENDENT_AMBULATORY_CARE_PROVIDER_SITE_OTHER): Payer: Self-pay | Admitting: Nurse Practitioner

## 2019-08-07 ENCOUNTER — Other Ambulatory Visit: Payer: Self-pay

## 2019-08-07 DIAGNOSIS — Z20822 Contact with and (suspected) exposure to covid-19: Secondary | ICD-10-CM | POA: Diagnosis not present

## 2019-08-07 DIAGNOSIS — Z01812 Encounter for preprocedural laboratory examination: Secondary | ICD-10-CM | POA: Diagnosis present

## 2019-08-07 LAB — SARS CORONAVIRUS 2 (TAT 6-24 HRS): SARS Coronavirus 2: NEGATIVE

## 2019-08-09 ENCOUNTER — Encounter: Payer: Self-pay | Admitting: Anesthesiology

## 2019-08-09 ENCOUNTER — Ambulatory Visit
Admission: RE | Admit: 2019-08-09 | Discharge: 2019-08-09 | Disposition: A | Discharge status: Home / Self Care | Payer: Medicare Other | Source: Ambulatory Visit | Attending: Vascular Surgery | Admitting: Vascular Surgery

## 2019-08-09 ENCOUNTER — Encounter
Admission: RE | Disposition: A | Discharge status: Home / Self Care | Payer: Self-pay | Source: Ambulatory Visit | Attending: Vascular Surgery

## 2019-08-09 ENCOUNTER — Telehealth (INDEPENDENT_AMBULATORY_CARE_PROVIDER_SITE_OTHER): Payer: Self-pay

## 2019-08-09 ENCOUNTER — Encounter: Payer: Self-pay | Admitting: Vascular Surgery

## 2019-08-09 ENCOUNTER — Other Ambulatory Visit: Payer: Self-pay

## 2019-08-09 DIAGNOSIS — Z8261 Family history of arthritis: Secondary | ICD-10-CM | POA: Insufficient documentation

## 2019-08-09 DIAGNOSIS — I70222 Atherosclerosis of native arteries of extremities with rest pain, left leg: Secondary | ICD-10-CM | POA: Diagnosis present

## 2019-08-09 DIAGNOSIS — J449 Chronic obstructive pulmonary disease, unspecified: Secondary | ICD-10-CM | POA: Insufficient documentation

## 2019-08-09 DIAGNOSIS — E785 Hyperlipidemia, unspecified: Secondary | ICD-10-CM | POA: Diagnosis not present

## 2019-08-09 DIAGNOSIS — I10 Essential (primary) hypertension: Secondary | ICD-10-CM | POA: Insufficient documentation

## 2019-08-09 DIAGNOSIS — Z79899 Other long term (current) drug therapy: Secondary | ICD-10-CM | POA: Diagnosis not present

## 2019-08-09 DIAGNOSIS — M1990 Unspecified osteoarthritis, unspecified site: Secondary | ICD-10-CM | POA: Diagnosis not present

## 2019-08-09 DIAGNOSIS — I70229 Atherosclerosis of native arteries of extremities with rest pain, unspecified extremity: Secondary | ICD-10-CM

## 2019-08-09 DIAGNOSIS — I70223 Atherosclerosis of native arteries of extremities with rest pain, bilateral legs: Secondary | ICD-10-CM | POA: Diagnosis not present

## 2019-08-09 DIAGNOSIS — Z7901 Long term (current) use of anticoagulants: Secondary | ICD-10-CM | POA: Diagnosis not present

## 2019-08-09 HISTORY — PX: LOWER EXTREMITY ANGIOGRAPHY: CATH118251

## 2019-08-09 LAB — BUN: BUN: 13 mg/dL (ref 8–23)

## 2019-08-09 LAB — CREATININE, SERUM
Creatinine, Ser: 0.8 mg/dL (ref 0.44–1.00)
GFR calc Af Amer: >60 mL/min (ref >60)
GFR calc non Af Amer: >60 mL/min (ref >60)

## 2019-08-09 SURGERY — LOWER EXTREMITY ANGIOGRAPHY
Anesthesia: Moderate Sedation | Site: Leg Lower | Laterality: Right

## 2019-08-09 MED ORDER — FENTANYL CITRATE (PF) 100 MCG/2ML IJ SOLN
INTRAMUSCULAR | Status: DC | PRN
  Administered 2019-08-09 (×2): 50 ug via INTRAVENOUS

## 2019-08-09 MED ORDER — HYDROMORPHONE HCL 1 MG/ML IJ SOLN
INTRAMUSCULAR | Status: AC
  Filled 2019-08-09: qty 1

## 2019-08-09 MED ORDER — FENTANYL CITRATE (PF) 100 MCG/2ML IJ SOLN
INTRAMUSCULAR | Status: AC
  Filled 2019-08-09: qty 2

## 2019-08-09 MED ORDER — ASPIRIN EC 81 MG PO TBEC: 81.0000 mg | DELAYED_RELEASE_TABLET | Freq: Every day | ORAL | 2 refills | Status: DC

## 2019-08-09 MED ORDER — SODIUM CHLORIDE 0.9 % IV SOLN: INTRAVENOUS | Status: DC

## 2019-08-09 MED ORDER — LABETALOL HCL 5 MG/ML IV SOLN: 10.0000 mg | INTRAVENOUS | Status: DC | PRN

## 2019-08-09 MED ORDER — METHYLPREDNISOLONE SODIUM SUCC 125 MG IJ SOLR: 125.0000 mg | Freq: Once | INTRAMUSCULAR | Status: DC | PRN

## 2019-08-09 MED ORDER — SODIUM CHLORIDE 0.9% FLUSH: 3.0000 mL | INTRAVENOUS | Status: DC | PRN

## 2019-08-09 MED ORDER — HEPARIN SODIUM (PORCINE) 1000 UNIT/ML IJ SOLN
INTRAMUSCULAR | Status: AC
  Filled 2019-08-09: qty 1

## 2019-08-09 MED ORDER — ACETAMINOPHEN 325 MG PO TABS: 650.0000 mg | ORAL_TABLET | ORAL | Status: DC | PRN

## 2019-08-09 MED ORDER — ONDANSETRON HCL 4 MG/2ML IJ SOLN: 4.0000 mg | Freq: Four times a day (QID) | INTRAMUSCULAR | Status: DC | PRN

## 2019-08-09 MED ORDER — FAMOTIDINE 20 MG PO TABS: 40.0000 mg | ORAL_TABLET | Freq: Once | ORAL | Status: DC | PRN

## 2019-08-09 MED ORDER — MIDAZOLAM HCL 5 MG/5ML IJ SOLN
INTRAMUSCULAR | Status: AC
  Filled 2019-08-09: qty 5

## 2019-08-09 MED ORDER — HYDRALAZINE HCL 20 MG/ML IJ SOLN: 5.0000 mg | INTRAMUSCULAR | Status: DC | PRN

## 2019-08-09 MED ORDER — MIDAZOLAM HCL 2 MG/ML PO SYRP: 8.0000 mg | ORAL_SOLUTION | Freq: Once | ORAL | Status: DC | PRN

## 2019-08-09 MED ORDER — DIPHENHYDRAMINE HCL 50 MG/ML IJ SOLN: 50.0000 mg | Freq: Once | INTRAMUSCULAR | Status: DC | PRN

## 2019-08-09 MED ORDER — HYDROMORPHONE HCL 1 MG/ML IJ SOLN
1.0000 mg | Freq: Once | INTRAMUSCULAR | Status: AC | PRN
  Administered 2019-08-09: 1 mg via INTRAVENOUS

## 2019-08-09 MED ORDER — CEFAZOLIN SODIUM-DEXTROSE 2-4 GM/100ML-% IV SOLN
2.0000 g | Freq: Once | INTRAVENOUS | Status: AC
  Administered 2019-08-09: 2 g via INTRAVENOUS

## 2019-08-09 MED ORDER — ASPIRIN EC 81 MG PO TBEC: 81.0000 mg | DELAYED_RELEASE_TABLET | Freq: Every day | ORAL | Status: DC

## 2019-08-09 MED ORDER — HEPARIN SODIUM (PORCINE) 1000 UNIT/ML IJ SOLN
INTRAMUSCULAR | Status: DC | PRN
  Administered 2019-08-09: 3000 [IU] via INTRAVENOUS
  Administered 2019-08-09: 2000 [IU] via INTRAVENOUS

## 2019-08-09 MED ORDER — MIDAZOLAM HCL 2 MG/2ML IJ SOLN
INTRAMUSCULAR | Status: DC | PRN
  Administered 2019-08-09 (×2): 2 mg via INTRAVENOUS

## 2019-08-09 MED ORDER — SODIUM CHLORIDE 0.9% FLUSH: 3.0000 mL | Freq: Two times a day (BID) | INTRAVENOUS | Status: DC

## 2019-08-09 MED ORDER — IODIXANOL 320 MG/ML IV SOLN
INTRAVENOUS | Status: DC | PRN
  Administered 2019-08-09: 65 mL

## 2019-08-09 MED ORDER — SODIUM CHLORIDE 0.9 % IV SOLN: 250.0000 mL | INTRAVENOUS | Status: DC | PRN

## 2019-08-09 SURGICAL SUPPLY — 18 items
BALLN ULTRVRSE 4X60X75 (BALLOONS) ×2
BALLN ULTRVRSE 6X60X75C (BALLOONS) ×2
BALLOON ULTRVRSE 4X60X75 (BALLOONS) IMPLANT
BALLOON ULTRVRSE 6X60X75C (BALLOONS) IMPLANT
CATH BEACON 5 .035 40 KMP TP (CATHETERS) IMPLANT
CATH BEACON 5 .038 40 KMP TP (CATHETERS) ×1
CATH PIG 70CM (CATHETERS) ×1 IMPLANT
DEVICE PRESTO INFLATION (MISCELLANEOUS) ×1 IMPLANT
DEVICE STARCLOSE SE CLOSURE (Vascular Products) ×2 IMPLANT
GLIDEWIRE ADV .035X180CM (WIRE) ×1 IMPLANT
INTRODUCER 7FR 23CM (INTRODUCER) ×2 IMPLANT
PACK ANGIOGRAPHY (CUSTOM PROCEDURE TRAY) ×1 IMPLANT
SHEATH BRITE TIP 5FRX11 (SHEATH) ×2 IMPLANT
STENT LIFESTREAM 8X58X80 (Permanent Stent) ×2 IMPLANT
SYR MEDRAD MARK 7 150ML (SYRINGE) ×1 IMPLANT
TUBING CONTRAST HIGH PRESS 72 (TUBING) ×1 IMPLANT
WIRE J 3MM .035X145CM (WIRE) ×1 IMPLANT
WIRE MAGIC TOR.035 180C (WIRE) ×1 IMPLANT

## 2019-08-09 NOTE — Discharge Instructions (Signed)
Moderate Conscious Sedation, Adult, Care After These instructions provide you with information about caring for yourself after your procedure. Your health care provider may also give you more specific instructions. Your treatment has been planned according to current medical practices, but problems sometimes occur. Call your health care provider if you have any problems or questions after your procedure. What can I expect after the procedure? After your procedure, it is common:  To feel sleepy for several hours.  To feel clumsy and have poor balance for several hours.  To have poor judgment for several hours.  To vomit if you eat too soon. Follow these instructions at home: For at least 24 hours after the procedure:   Do not: ? Participate in activities where you could fall or become injured. ? Drive. ? Use heavy machinery. ? Drink alcohol. ? Take sleeping pills or medicines that cause drowsiness. ? Make important decisions or sign legal documents. ? Take care of children on your own.  Rest. Eating and drinking  Follow the diet recommended by your health care provider.  If you vomit: ? Drink water, juice, or soup when you can drink without vomiting. ? Make sure you have little or no nausea before eating solid foods. General instructions  Have a responsible adult stay with you until you are awake and alert.  Take over-the-counter and prescription medicines only as told by your health care provider.  If you smoke, do not smoke without supervision.  Keep all follow-up visits as told by your health care provider. This is important. Contact a health care provider if:  You keep feeling nauseous or you keep vomiting.  You feel light-headed.  You develop a rash.  You have a fever. Get help right away if:  You have trouble breathing. This information is not intended to replace advice given to you by your health care provider. Make sure you discuss any questions you have  with your health care provider. Document Revised: 01/07/2017 Document Reviewed: 05/17/2015 Elsevier Patient Education  [place] After This sheet gives you information about how to care for yourself after your procedure. Your doctor may also give you more specific instructions. If you have problems or questions, contact your doctor. Follow these instructions at home: Insertion site care  Follow instructions from your doctor about how to take care of your long, thin tube (catheter) insertion area. Make sure you: ? Wash your hands with soap and water before you change your bandage (dressing). If you cannot use soap and water, use hand sanitizer. ? Change your bandage as told by your doctor. ? Leave stitches (sutures), skin glue, or skin tape (adhesive) strips in place. They may need to stay in place for 2 weeks or longer. If tape strips get loose and curl up, you may trim the loose edges. Do not remove tape strips completely unless your doctor says it is okay.  Do not take baths, swim, or use a hot tub until your doctor says it is okay.  You may shower 24-48 hours after the procedure or as told by your doctor. ? Gently wash the area with plain soap and water. ? Pat the area dry with a clean towel. ? Do not rub the area. This may cause bleeding.  Do not apply powder or lotion to the area. Keep the area clean and dry.  Check your insertion area every day for signs of infection. Check for: ? More redness, swelling, or pain. ? Fluid or blood. ?  Warmth. ? Pus or a bad smell. Activity  Rest as told by your doctor, usually for 1-2 days.  Do not lift anything that is heavier than 10 lbs. (4.5 kg) or as told by your doctor.  Do not drive for 24 hours if you were given a medicine to help you relax (sedative).  Do not drive or use heavy machinery while taking prescription pain medicine. General instructions   Go back to your normal activities as told by your  doctor, usually in about a week. Ask your doctor what activities are safe for you.  If the insertion area starts to bleed, lie flat and put pressure on the area. If the bleeding does not stop, get help right away. This is an emergency.  Drink enough fluid to keep your pee (urine) clear or pale yellow.  Take over-the-counter and prescription medicines only as told by your doctor.  Keep all follow-up visits as told by your doctor. This is important. Contact a doctor if:  You have a fever.  You have chills.  You have more redness, swelling, or pain around your insertion area.  You have fluid or blood coming from your insertion area.  The insertion area feels warm to the touch.  You have pus or a bad smell coming from your insertion area.  You have more bruising around the insertion area.  Blood collects in the tissue around the insertion area (hematoma) that may be painful to the touch. Get help right away if:  You have a lot of pain in the insertion area.  The insertion area swells very fast.  The insertion area is bleeding, and the bleeding does not stop after holding steady pressure on the area.  The area near or just beyond the insertion area becomes pale, cool, tingly, or numb. These symptoms may be an emergency. Do not wait to see if the symptoms will go away. Get medical help right away. Call your local emergency services (911 in the [place] Do not drive yourself to the hospital. Summary  After the procedure, it is common to have bruising and tenderness at the long, thin tube insertion area.  After the procedure, it is important to rest and drink plenty of fluids.  Do not take baths, swim, or use a hot tub until your doctor says it is okay to do so. You may shower 24-48 hours after the procedure or as told by your doctor.  If the insertion area starts to bleed, lie flat and put pressure on the area. If the bleeding does not stop, get help right away. This is an  emergency. This information is not intended to replace advice given to you by your health care provider. Make sure you discuss any questions you have with your health care provider. Document Revised: 01/07/2017 Document Reviewed: 01/20/2016 Elsevier Patient Education  2020 [employer].

## 2019-08-09 NOTE — Telephone Encounter (Signed)
  Dr. Lucky Cowboy:  The patient will not need her other leg angiogram next week. She had aortoiliac disease and we stented both sides today  Patient's other leg angio has been canceled.

## 2019-08-09 NOTE — H&P (Signed)
[facility] History & Physical Update  The patient was interviewed and re-examined.  The patient's previous History and Physical has been reviewed and is unchanged.  There is no change in the plan of care. We plan to proceed with the scheduled procedure.  Leotis Pain, MD  08/09/2019, 8:03 AM

## 2019-08-09 NOTE — Op Note (Signed)
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  Ultrasound guidance for vascular access bilateral femoral arteries             2.  Catheter placement into aorta from bilateral femoral approaches             3.  Aortogram and selective bilateral lower extremity angiogram             4.  Percutaneous transluminal angioplasty of left common iliac artery to predilate the lesion with 4 and 6 mm diameter angioplasty balloon             5.   Kissing stent placements to bilateral common iliac arteries extending into the terminal aorta with 8 mm diameter by 58 mm length stents bilaterally  6.  StarClose closure device bilateral femoral artery  EBL: 10 cc  Contrast: 65 cc  Fluoro Time: 5.9 minutes  Moderate Conscious Sedation Time: approximately 45 minutes using 4 mg of Versed and 100 mcg of Fentanyl              Indications:  Patient is a 74 y.o.female with disabling claudication symptoms of both legs and now with symptoms of rest Trujillo on the left.  She is following and basically not able to walk at this point. The patient has noninvasive study showing significant reduction in the ABIs bilaterally with monophasic waveforms throughout. The patient is brought in for angiography for further evaluation and potential treatment.  Due to the limb threatening nature of the situation, angiogram was performed for attempted limb salvage. The patient is aware that if the procedure fails, amputation would be expected.  The patient also understands that even with successful revascularization, amputation may still be required due to the severity of the situation.  Risks and benefits are discussed and  informed consent is obtained.   Procedure:  The patient was identified and appropriate procedural time out was performed.  The patient was then placed supine on the table and prepped and draped in the usual sterile fashion. Moderate conscious sedation was administered during a face to face encounter with the patient throughout the procedure with my supervision of the RN administering medicines and monitoring the patient's vital signs, pulse oximetry, telemetry and mental status throughout from the start of the procedure until the patient was taken to the recovery room. Ultrasound was used to evaluate the left common femoral artery.  It was patent but small.  A digital ultrasound image was acquired.  A Seldinger needle was used to access the left common femoral artery under direct ultrasound guidance and a permanent image was performed.  A 0.035 J wire was advanced without resistance and a 5Fr sheath was placed.   Initially, the J-wire would not pass and an image was done through the sheath showing the left common iliac artery to be occluded.  Pigtail catheter was placed into the aorta and an AP aortogram was performed. The renal arteries appeared widely patent.  The aorta was fairly normal until the distal aorta which became highly calcific and there was severe disease at the terminal aorta extending into both common iliac arteries.  The left common iliac artery was occluded.  The right common iliac artery had about a 90 to 95% stenosis.  There were large lumbar collaterals feeding hypogastric arteries which were patent.  The external iliac arteries were somewhat small but not obviously stenotic. I then accessed the right common femoral artery under direct ultrasound guidance and a permanent image was recorded.  A wire and 5 French sheath were placed.  The patient was systemically heparinized with 5000 units of heparin.  I then crossed the high-grade stenosis in the right common iliac artery with the Kumpe  catheter and the advantage wire confirming intraluminal flow in the aorta.  I then replaced the advantage wire and upsized to a 7 French sheath on the right.  This was a 23 cm 7 [place] sheath.  I then placed this on the left, but it would not cross the occlusive lesion.  I predilated the lesion with a 4 and a 6 mm balloon and then advanced the sheath into the distal aorta.  Now that we had she is on both sides, I selected an 8 mm diameter by 58 mm length lifestream stent for each common iliac artery staying just above the hypogastric artery and terminating about 5 to 7 mm into the aorta bilaterally.  These balloons were inflated simultaneously to 12 atm.  Completion imaging showed less than 10% residual stenosis in both iliac arteries with brisk flow.  I then elected to image both lower extremities to make sure that infrainguinal disease was not present as well.  Through the right femoral sheath, the right lower extremity was imaged. Some disease of the common femoral artery but this did not appear severe.  The profunda femoris artery and superficial femoral arteries were widely patent.  The popliteal artery was patent.  There appeared to be three-vessel runoff distally.  I then image the left lower extremity through the left femoral sheath.  This demonstrated minimal left common femoral disease with normal profunda femoris artery, superficial femoral artery, and popliteal arteries.  Two-vessel runoff was seen distally.  I elected to terminate the procedure. The sheath was removed and StarClose closure device was deployed in the left femoral artery with excellent hemostatic result.  Similarly, StarClose closure device was deployed in the right femoral artery with excellent hemostatic result.  The patient was taken to the recovery room in stable condition having tolerated the procedure well.  Findings:               Aortogram:  The renal arteries appeared widely patent.  The aorta was fairly normal until the  distal aorta which became highly calcific and there was severe disease at the terminal aorta extending into both common iliac arteries.  The left common iliac artery was occluded.  The right common iliac artery had about a 90 to 95% stenosis.  There were large lumbar collaterals feeding hypogastric arteries which were patent.  The external iliac arteries were somewhat small but not obviously stenotic.             Right Lower Extremity:  Some disease of the common femoral artery but this did not appear severe.  The profunda femoris artery and superficial femoral arteries were widely patent.  The popliteal artery was patent.  There appeared to be three-vessel runoff distally.  Left Lower Extremity:  This demonstrated minimal left common femoral disease with normal profunda femoris artery, superficial femoral artery, and popliteal arteries.  Two-vessel runoff was seen distally.  Disposition: Patient was taken to the recovery room in stable condition having tolerated  the procedure well.  Complications: None  Erin Trujillo 08/09/2019 10:34 AM   This note was created with Dragon Medical transcription system. Any errors in dictation are purely unintentional.

## 2019-08-14 ENCOUNTER — Other Ambulatory Visit: Payer: Medicare Other

## 2019-08-16 ENCOUNTER — Ambulatory Visit: Admit: 2019-08-16 | Payer: Medicare Other | Admitting: Vascular Surgery

## 2019-08-16 SURGERY — LOWER EXTREMITY ANGIOGRAPHY
Anesthesia: Moderate Sedation | Site: Leg Lower | Laterality: Left

## 2019-08-17 ENCOUNTER — Telehealth (INDEPENDENT_AMBULATORY_CARE_PROVIDER_SITE_OTHER): Payer: Self-pay

## 2019-08-17 NOTE — Telephone Encounter (Signed)
Patient called stating that she had a right leg angio on 08/09/19 and since then her left foot has been red and swelling up to the ankle with heat. Patient states no other symptoms

## 2019-09-04 ENCOUNTER — Emergency Department: Payer: Medicare Other

## 2019-09-04 ENCOUNTER — Encounter: Payer: Self-pay | Admitting: Emergency Medicine

## 2019-09-04 ENCOUNTER — Emergency Department
Admission: EM | Admit: 2019-09-04 | Discharge: 2019-09-04 | Disposition: A | Discharge status: Home / Self Care | Payer: Medicare Other | Attending: Emergency Medicine | Admitting: Emergency Medicine

## 2019-09-04 DIAGNOSIS — Z5321 Procedure and treatment not carried out due to patient leaving prior to being seen by health care provider: Secondary | ICD-10-CM | POA: Diagnosis not present

## 2019-09-04 DIAGNOSIS — S0191XA Laceration without foreign body of unspecified part of head, initial encounter: Secondary | ICD-10-CM | POA: Diagnosis not present

## 2019-09-04 DIAGNOSIS — Y999 Unspecified external cause status: Secondary | ICD-10-CM | POA: Diagnosis not present

## 2019-09-04 DIAGNOSIS — Y929 Unspecified place or not applicable: Secondary | ICD-10-CM | POA: Diagnosis not present

## 2019-09-04 DIAGNOSIS — W19XXXA Unspecified fall, initial encounter: Secondary | ICD-10-CM | POA: Insufficient documentation

## 2019-09-04 DIAGNOSIS — S0990XA Unspecified injury of head, initial encounter: Secondary | ICD-10-CM | POA: Diagnosis present

## 2019-09-04 DIAGNOSIS — Y939 Activity, unspecified: Secondary | ICD-10-CM | POA: Diagnosis not present

## 2019-09-04 LAB — URINALYSIS, COMPLETE (UACMP) WITH MICROSCOPIC
Bacteria, UA: NONE SEEN
Bilirubin Urine: NEGATIVE
Glucose, UA: NEGATIVE mg/dL
Ketones, ur: NEGATIVE mg/dL
Leukocytes,Ua: NEGATIVE
Nitrite: NEGATIVE
Protein, ur: NEGATIVE mg/dL
Specific Gravity, Urine: 1.01 (ref 1.005–1.030)
Squamous Epithelial / HPF: NONE SEEN (ref 0–5)
pH: 5 (ref 5.0–8.0)

## 2019-09-04 LAB — CBC
HCT: 40.9 % (ref 36.0–46.0)
Hemoglobin: 13.7 g/dL (ref 12.0–15.0)
MCH: 32.9 pg (ref 26.0–34.0)
MCHC: 33.5 g/dL (ref 30.0–36.0)
MCV: 98.1 fL (ref 80.0–100.0)
Platelets: 321 10*3/uL (ref 150–400)
RBC: 4.17 MIL/uL (ref 3.87–5.11)
RDW: 12.7 % (ref 11.5–15.5)
WBC: 8.6 10*3/uL (ref 4.0–10.5)
nRBC: 0 % (ref 0.0–0.2)

## 2019-09-04 LAB — BASIC METABOLIC PANEL
Anion gap: 15 (ref 5–15)
BUN: 19 mg/dL (ref 8–23)
CO2: 25 mmol/L (ref 22–32)
Calcium: 9.3 mg/dL (ref 8.9–10.3)
Chloride: 92 mmol/L — ABNORMAL LOW (ref 98–111)
Creatinine, Ser: 0.79 mg/dL (ref 0.44–1.00)
GFR calc Af Amer: >60 mL/min (ref >60)
GFR calc non Af Amer: >60 mL/min (ref >60)
Glucose, Bld: 112 mg/dL — ABNORMAL HIGH (ref 70–99)
Potassium: 3.6 mmol/L (ref 3.5–5.1)
Sodium: 132 mmol/L — ABNORMAL LOW (ref 135–145)

## 2019-09-04 LAB — ETHANOL: Alcohol, Ethyl (B): 204 mg/dL — ABNORMAL HIGH (ref <10)

## 2019-09-04 LAB — TROPONIN I (HIGH SENSITIVITY): Troponin I (High Sensitivity): 7 ng/L (ref <18)

## 2019-09-04 MED ORDER — ACETAMINOPHEN 325 MG PO TABS
650.0000 mg | ORAL_TABLET | Freq: Once | ORAL | Status: AC
  Administered 2019-09-04: 650 mg via ORAL
  Filled 2019-09-04: qty 2

## 2019-09-04 NOTE — ED Notes (Signed)
Pt called x's 3, no response ?

## 2019-09-04 NOTE — ED Notes (Signed)
Pt assisted to BR, steady gait noted; tylenol admin at pt request for generalized HA

## 2019-09-04 NOTE — ED Triage Notes (Signed)
Pt reports she was in bathroom and the next thing she remembered was that she was on the floor and bleeding. Pt reports she had "several" glasses of wine and was playing the computer games. Pt slurring words in triage. Pt cooperative and able to follow commands. On assessment, pt has 2 inch laceration as well as a 1 inch laceration to the posterior head. Bleeding controlled. New Bandage applied. Pt take Eliquis and Asprin. Hx/o Afib.

## 2019-09-06 ENCOUNTER — Other Ambulatory Visit (INDEPENDENT_AMBULATORY_CARE_PROVIDER_SITE_OTHER): Payer: Self-pay | Admitting: Vascular Surgery

## 2019-09-06 ENCOUNTER — Encounter (INDEPENDENT_AMBULATORY_CARE_PROVIDER_SITE_OTHER): Payer: Medicare Other

## 2019-09-06 ENCOUNTER — Ambulatory Visit (INDEPENDENT_AMBULATORY_CARE_PROVIDER_SITE_OTHER): Payer: Medicare Other | Admitting: Nurse Practitioner

## 2019-09-06 DIAGNOSIS — I70222 Atherosclerosis of native arteries of extremities with rest pain, left leg: Secondary | ICD-10-CM

## 2019-09-06 DIAGNOSIS — Z95820 Peripheral vascular angioplasty status with implants and grafts: Secondary | ICD-10-CM

## 2019-09-07 ENCOUNTER — Other Ambulatory Visit: Payer: Self-pay

## 2019-09-07 ENCOUNTER — Encounter (INDEPENDENT_AMBULATORY_CARE_PROVIDER_SITE_OTHER): Payer: Self-pay | Admitting: Nurse Practitioner

## 2019-09-07 ENCOUNTER — Ambulatory Visit (INDEPENDENT_AMBULATORY_CARE_PROVIDER_SITE_OTHER): Payer: Medicare Other | Admitting: Nurse Practitioner

## 2019-09-07 ENCOUNTER — Ambulatory Visit (INDEPENDENT_AMBULATORY_CARE_PROVIDER_SITE_OTHER): Payer: Medicare Other

## 2019-09-07 VITALS — BP 112/61 | HR 71 | Resp 16 | Wt 179.8 lb

## 2019-09-07 DIAGNOSIS — Z720 Tobacco use: Secondary | ICD-10-CM | POA: Diagnosis not present

## 2019-09-07 DIAGNOSIS — I70213 Atherosclerosis of native arteries of extremities with intermittent claudication, bilateral legs: Secondary | ICD-10-CM | POA: Diagnosis not present

## 2019-09-07 DIAGNOSIS — G4700 Insomnia, unspecified: Secondary | ICD-10-CM | POA: Insufficient documentation

## 2019-09-07 DIAGNOSIS — I70222 Atherosclerosis of native arteries of extremities with rest pain, left leg: Secondary | ICD-10-CM

## 2019-09-07 DIAGNOSIS — Z95820 Peripheral vascular angioplasty status with implants and grafts: Secondary | ICD-10-CM | POA: Diagnosis not present

## 2019-09-07 DIAGNOSIS — R29898 Other symptoms and signs involving the musculoskeletal system: Secondary | ICD-10-CM | POA: Diagnosis not present

## 2019-09-07 DIAGNOSIS — I10 Essential (primary) hypertension: Secondary | ICD-10-CM

## 2019-09-07 DIAGNOSIS — L508 Other urticaria: Secondary | ICD-10-CM | POA: Insufficient documentation

## 2019-09-10 NOTE — Progress Notes (Signed)
Subjective:    Patient ID: Erin Trujillo, female    DOB: 07-17-1945, 74 y.o.   MRN: [id number] Chief Complaint  Patient presents with  . Follow-up    [facility] 4wk le angio    The patient returns to the office for followup and review status post angiogram with intervention. The patient notes improvement in the lower extremity symptoms. No interval shortening of the patient's claudication distance or rest pain symptoms. No new ulcers or wounds have occurred since the last visit.  The patient notes that the previous bilateral lower extremity weakness has greatly improved.  Even with the improvement however, the patient did lose her balance recently and hit her head.  She recently received 10 staples for that laceration.  There have been no significant changes to the patient's overall health care.  The patient denies amaurosis fugax or recent TIA symptoms. There are no recent neurological changes noted. The patient denies history of DVT, PE or superficial thrombophlebitis. The patient denies recent episodes of angina or shortness of breath.   ABI's Rt=1.15 and Lt=1.11  (previous ABI's Rt=0.66 and Lt=0.54) Duplex US of the bilateral tibial arteries reveals biphasic/ triphasic waveforms bilaterally with good toe waveforms.   Review of Systems  Skin: Positive for wound.  Hematological: Bruises/bleeds easily.  All other systems reviewed and are negative.      Objective:   Physical Exam Vitals reviewed.  HENT:     Head: Normocephalic.  Cardiovascular:     Rate and Rhythm: Normal rate.     Pulses: Normal pulses.  Pulmonary:     Effort: Pulmonary effort is normal.  Musculoskeletal:        General: Normal range of motion.  Skin:    General: Skin is warm and dry.     Capillary Refill: Capillary refill takes less than 2 seconds.  Neurological:     Mental Status: She is alert and oriented to person, place, and time.  Psychiatric:        Mood and Affect: Mood normal.        Behavior:  Behavior normal.        Thought Content: Thought content normal.        Judgment: Judgment normal.     BP (!) 112/61 (BP Location: Right Arm)   Pulse 71   Resp 16   Wt 179 lb 12.8 oz (81.6 kg)   BMI 29.02 kg/m   Past Medical History:  Diagnosis Date  . Arthritis   . COPD (chronic obstructive pulmonary disease) ([place])   . Hyperlipidemia   . Hypertension   . Squamous cell skin cancer     Social History   Socioeconomic History  . Marital status: Divorced    Spouse name: Not on file  . Number of children: Not on file  . Years of education: Not on file  . Highest education level: Not on file  Occupational History  . Not on file  Tobacco Use  . Smoking status: Never Smoker  . Smokeless tobacco: Never Used  Substance and Sexual Activity  . Alcohol use: No  . Drug use: Never  . Sexual activity: Not on file  Other Topics Concern  . Not on file  Social History Narrative  . Not on file   Social Determinants of Health   Financial Resource Strain:   . Difficulty of Paying Living Expenses:   Food Insecurity:   . Worried About Charity fundraiser in the Last Year:   . [employer]  of Food in the Last Year:   Transportation Needs:   . Film/video editor (Medical):   Marland Kitchen Lack of Transportation (Non-Medical):   Physical Activity:   . Days of Exercise per Week:   . Minutes of Exercise per Session:   Stress:   . Feeling of Stress :   Social Connections:   . Frequency of Communication with Friends and Family:   . Frequency of Social Gatherings with Friends and Family:   . Attends Religious Services:   . Active Member of Clubs or Organizations:   . Attends Archivist Meetings:   Marland Kitchen Marital Status:   Intimate Partner Violence:   . Fear of Current or Ex-Partner:   . Emotionally Abused:   Marland Kitchen Physically Abused:   . Sexually Abused:     Past Surgical History:  Procedure Laterality Date  . ABDOMINAL HYSTERECTOMY    . APPENDECTOMY    . CHOLECYSTECTOMY    . LOWER  EXTREMITY ANGIOGRAPHY Right 08/09/2019   Procedure: LOWER EXTREMITY ANGIOGRAPHY;  Surgeon: Algernon Huxley, MD;  Location: [facility];  Service: Cardiovascular;  Laterality: Right;  . spine cyst removal    . TONSILLECTOMY      Family History  Problem Relation Age of Onset  . Arthritis Mother   . Anxiety disorder Mother   . Osteoporosis Mother   . Hypertension Father   . Stroke Father   . Cancer Son   . Breast cancer Neg Hx     Allergies  Allergen Reactions  . Ciprofloxacin Nausea Only       Assessment & Plan:   1. Atherosclerosis of native artery of both lower extremities with intermittent claudication (HCC) Recommend:  The patient is status post successful angiogram with intervention.  The patient reports that the claudication symptoms and leg pain is essentially gone.   The patient denies lifestyle limiting changes at this point in time.  No further invasive studies, angiography or surgery at this time The patient should continue walking and begin a more formal exercise program.  The patient should continue antiplatelet therapy and aggressive treatment of the lipid abnormalities  Smoking cessation was again discussed  The patient should continue wearing graduated compression socks 10-15 mmHg strength to control the mild edema.  Patient should undergo noninvasive studies as ordered. The patient will follow up with me after the studies.   Patient will return in 3 months with noninvasive studies   2. Essential hypertension Continue antihypertensive medications as already ordered, these medications have been reviewed and there are no changes at this time.  3. Weakness of both lower extremities The patient's lower extremity weakness is greatly improved post intervention.  The patient is advised to try to continue to walk to rebuild muscle strength.  4. Tobacco abuse Patient has stop smoking.  Smoking cessation is encouraged   Current Outpatient Medications on  File Prior to Visit  Medication Sig Dispense Refill  . acetaminophen (TYLENOL) 500 MG tablet Take 500 mg by mouth.    Marland Kitchen amLODipine (NORVASC) 5 MG tablet Take 5 mg by mouth daily.    Marland Kitchen aspirin EC 81 MG tablet Take 1 tablet (81 mg total) by mouth daily. 150 tablet 2  . COMBIVENT RESPIMAT 20-100 MCG/ACT AERS respimat Inhale 1 puff into the lungs 4 (four) times daily.    . diclofenac sodium (VOLTAREN) 1 % GEL Apply topically.    . DULoxetine (CYMBALTA) 60 MG capsule Take 60 mg by mouth daily.    Marland Kitchen  ELIQUIS 5 MG TABS tablet 5 mg 2 (two) times daily.   4  . losartan-hydrochlorothiazide (HYZAAR) 100-25 MG tablet Take 1 tablet by mouth daily.    . metoprolol succinate (TOPROL-XL) 100 MG 24 hr tablet Take 100 mg by mouth daily.    . simvastatin (ZOCOR) 40 MG tablet Take 40 mg by mouth daily at 6 PM.   0  . buPROPion (WELLBUTRIN XL) 300 MG 24 hr tablet Take 300 mg by mouth. (Patient not taking: Reported on 08/01/2019)    . cephALEXin (KEFLEX) 500 MG capsule Take 500 mg by mouth 3 (three) times daily.    . mupirocin ointment (BACTROBAN) 2 % SMARTSIG:1 Application Topical 2-3 Times Daily    . primidone (MYSOLINE) 50 MG tablet Take 50 mg by mouth 2 (two) times daily.     No current facility-administered medications on file prior to visit.    There are no Patient Instructions on file for this visit. No follow-ups on file.   Kris Hartmann, NP

## 2019-09-11 ENCOUNTER — Encounter (INDEPENDENT_AMBULATORY_CARE_PROVIDER_SITE_OTHER): Payer: Self-pay | Admitting: Nurse Practitioner

## 2019-12-03 ENCOUNTER — Other Ambulatory Visit (INDEPENDENT_AMBULATORY_CARE_PROVIDER_SITE_OTHER): Payer: Self-pay | Admitting: Nurse Practitioner

## 2019-12-03 DIAGNOSIS — I70213 Atherosclerosis of native arteries of extremities with intermittent claudication, bilateral legs: Secondary | ICD-10-CM

## 2019-12-03 DIAGNOSIS — Z95820 Peripheral vascular angioplasty status with implants and grafts: Secondary | ICD-10-CM

## 2019-12-04 ENCOUNTER — Ambulatory Visit (INDEPENDENT_AMBULATORY_CARE_PROVIDER_SITE_OTHER): Payer: Medicare Other | Admitting: Vascular Surgery

## 2019-12-04 ENCOUNTER — Encounter (INDEPENDENT_AMBULATORY_CARE_PROVIDER_SITE_OTHER): Payer: Self-pay | Admitting: Vascular Surgery

## 2019-12-04 ENCOUNTER — Other Ambulatory Visit: Payer: Self-pay

## 2019-12-04 ENCOUNTER — Ambulatory Visit (INDEPENDENT_AMBULATORY_CARE_PROVIDER_SITE_OTHER): Payer: Medicare Other

## 2019-12-04 VITALS — BP 172/79 | HR 57 | Ht 64.0 in | Wt 181.0 lb

## 2019-12-04 DIAGNOSIS — I70213 Atherosclerosis of native arteries of extremities with intermittent claudication, bilateral legs: Secondary | ICD-10-CM

## 2019-12-04 DIAGNOSIS — I70223 Atherosclerosis of native arteries of extremities with rest pain, bilateral legs: Secondary | ICD-10-CM

## 2019-12-04 DIAGNOSIS — E785 Hyperlipidemia, unspecified: Secondary | ICD-10-CM | POA: Diagnosis not present

## 2019-12-04 DIAGNOSIS — I10 Essential (primary) hypertension: Secondary | ICD-10-CM

## 2019-12-04 DIAGNOSIS — Z95820 Peripheral vascular angioplasty status with implants and grafts: Secondary | ICD-10-CM | POA: Diagnosis not present

## 2019-12-04 NOTE — Assessment & Plan Note (Signed)
lipid control important in reducing the progression of atherosclerotic disease. Continue statin therapy  

## 2019-12-04 NOTE — Assessment & Plan Note (Signed)
Her ABIs today are in the normal range at 1.19 on the right and 1.29 on the left with brisk triphasic waveforms and normal digital pressures. Much improved after intervention. 6 month follow. Continue current medications.

## 2019-12-04 NOTE — Assessment & Plan Note (Signed)
blood pressure control important in reducing the progression of atherosclerotic disease. On appropriate oral medications.  

## 2019-12-04 NOTE — Progress Notes (Signed)
MRN : [id number]  Erin Trujillo is a 74 y.o. ([birth year]) female who presents with chief complaint of  Chief Complaint  Patient presents with  . Follow-up    11mo U/S follow up  .  History of Present Illness: Patient returns today in follow up of her PAD.  Almost 4 months ago, she underwent aortoiliac intervention for short distance claudication.  Her legs feel much better.  She is extremely pleased with the results.  She still has some mild swelling in the left leg and occasional pain but it is markedly improved from what it was prior to intervention.  Her ABIs today are in the normal range at 1.19 on the right and 1.29 on the left with brisk triphasic waveforms and normal digital pressures  Current Outpatient Medications  Medication Sig Dispense Refill  . acetaminophen (TYLENOL) 500 MG tablet Take 500 mg by mouth.    Marland Kitchen amLODipine (NORVASC) 5 MG tablet Take 5 mg by mouth daily.    . ARIPiprazole (ABILIFY) 5 MG tablet Take by mouth.    Marland Kitchen aspirin EC 81 MG tablet Take 1 tablet (81 mg total) by mouth daily. 150 tablet 2  . cephALEXin (KEFLEX) 500 MG capsule Take 500 mg by mouth 3 (three) times daily.    . COMBIVENT RESPIMAT 20-100 MCG/ACT AERS respimat Inhale 1 puff into the lungs 4 (four) times daily.    . diclofenac sodium (VOLTAREN) 1 % GEL Apply topically.    . DULoxetine (CYMBALTA) 60 MG capsule Take 60 mg by mouth daily.    Marland Kitchen ELIQUIS 5 MG TABS tablet 5 mg 2 (two) times daily.   4  . losartan-hydrochlorothiazide (HYZAAR) 100-25 MG tablet Take 1 tablet by mouth daily.    . metoprolol succinate (TOPROL-XL) 100 MG 24 hr tablet Take 100 mg by mouth daily.    . mupirocin ointment (BACTROBAN) 2 % SMARTSIG:1 Application Topical 2-3 Times Daily    . simvastatin (ZOCOR) 40 MG tablet Take 40 mg by mouth daily at 6 PM.   0  . buPROPion (WELLBUTRIN XL) 300 MG 24 hr tablet Take 300 mg by mouth.  (Patient not taking: Reported on 12/04/2019)    . primidone (MYSOLINE) 50 MG tablet Take 50 mg by  mouth 2 (two) times daily.     No current facility-administered medications for this visit.    Past Medical History:  Diagnosis Date  . Arthritis   . COPD (chronic obstructive pulmonary disease) ([place])   . Hyperlipidemia   . Hypertension   . Squamous cell skin cancer     Past Surgical History:  Procedure Laterality Date  . ABDOMINAL HYSTERECTOMY    . APPENDECTOMY    . CHOLECYSTECTOMY    . LOWER EXTREMITY ANGIOGRAPHY Right 08/09/2019   Procedure: LOWER EXTREMITY ANGIOGRAPHY;  Surgeon: Algernon Huxley, MD;  Location: [facility];  Service: Cardiovascular;  Laterality: Right;  . spine cyst removal    . TONSILLECTOMY       Social History   Tobacco Use  . Smoking status: Never Smoker  . Smokeless tobacco: Never Used  Substance Use Topics  . Alcohol use: No  . Drug use: Never     Family History  Problem Relation Age of Onset  . Arthritis Mother   . Anxiety disorder Mother   . Osteoporosis Mother   . Hypertension Father   . Stroke Father   . Cancer Son   . Breast cancer Neg Hx     Allergies  Allergen Reactions  . Ciprofloxacin Nausea Only     REVIEW OF SYSTEMS (Negative unless checked)  Constitutional: [] Weight loss  [] Fever  [] Chills Cardiac: [] Chest pain   [] Chest pressure   [] Palpitations   [] Shortness of breath when laying flat   [] Shortness of breath at rest   [] Shortness of breath with exertion. Vascular:  [] Pain in legs with walking   [] Pain in legs at rest   [] Pain in legs when laying flat   [] Claudication   [] Pain in feet when walking  [] Pain in feet at rest  [] Pain in feet when laying flat   [] History of DVT   [] Phlebitis   [] Swelling in legs   [] Varicose veins   [] Non-healing ulcers Pulmonary:   [] Uses home oxygen   [] Productive cough   [] Hemoptysis   [] Wheeze  [] COPD   [] Asthma Neurologic:  [] Dizziness  [] Blackouts   [] Seizures   [] History of stroke   [] History of TIA  [] Aphasia   [] Temporary blindness   [] Dysphagia   [] Weakness or numbness in arms    [x] Weakness or numbness in legs Musculoskeletal:  [x] Arthritis   [] Joint swelling   [] Joint pain   [] Low back pain Hematologic:  [x] Easy bruising  [] Easy bleeding   [] Hypercoagulable state   [] Anemic   Gastrointestinal:  [] Blood in stool   [] Vomiting blood  [] Gastroesophageal reflux/heartburn   [] Abdominal pain Genitourinary:  [] Chronic kidney disease   [] Difficult urination  [] Frequent urination  [] Burning with urination   [] Hematuria Skin:  [] Rashes   [] Ulcers   [] Wounds Psychological:  [] History of anxiety   []  History of major depression.  Physical Examination  BP (!) 172/79   Pulse (!) 57   Ht 5\' 4"  (1.626 m)   Wt 181 lb (82.1 kg)   BMI 31.07 kg/m  Gen:  WD/WN, NAD Head: /AT, No temporalis wasting. Ear/Nose/Throat: Hearing grossly intact, nares w/o erythema or drainage Eyes: Conjunctiva clear. Sclera non-icteric Neck: Supple.  Trachea midline Pulmonary:  Good air movement, no use of accessory muscles.  Cardiac: RRR, no JVD Vascular:  Vessel Right Left  Radial Palpable Palpable                          PT Palpable Palpable  DP Palpable Palpable    Musculoskeletal: M/S 5/5 throughout.  No deformity or atrophy.  1+ left lower extremity edema. Neurologic: Sensation grossly intact in extremities.  Symmetrical.  Speech is fluent.  Psychiatric: Judgment intact, Mood & affect appropriate for pt's clinical situation. Dermatologic: No rashes or ulcers noted.  No cellulitis or open wounds.       Labs No results found for this or any previous visit (from the past 2160 hour(s)).  Radiology No results found.  Assessment/Plan  Essential hypertension blood pressure control important in reducing the progression of atherosclerotic disease. On appropriate oral medications.   Hyperlipidemia lipid control important in reducing the progression of atherosclerotic disease. Continue statin therapy   Atherosclerosis of native arteries of extremity with intermittent  claudication (HCC) Her ABIs today are in the normal range at 1.19 on the right and 1.29 on the left with brisk triphasic waveforms and normal digital pressures. Much improved after intervention. 6 month follow. Continue current medications.    Leotis Pain, MD  12/04/2019 1:10 PM    This note was created with Dragon medical transcription system.  Any errors from dictation are purely unintentional

## 2020-05-06 ENCOUNTER — Other Ambulatory Visit: Payer: Self-pay | Admitting: Internal Medicine

## 2020-05-06 DIAGNOSIS — Z1231 Encounter for screening mammogram for malignant neoplasm of breast: Secondary | ICD-10-CM

## 2020-06-03 ENCOUNTER — Ambulatory Visit (INDEPENDENT_AMBULATORY_CARE_PROVIDER_SITE_OTHER): Payer: Medicare Other | Admitting: Vascular Surgery

## 2020-06-03 ENCOUNTER — Encounter (INDEPENDENT_AMBULATORY_CARE_PROVIDER_SITE_OTHER): Payer: Medicare Other

## 2020-06-03 ENCOUNTER — Other Ambulatory Visit (INDEPENDENT_AMBULATORY_CARE_PROVIDER_SITE_OTHER): Payer: Self-pay | Admitting: Vascular Surgery

## 2020-06-03 DIAGNOSIS — I6523 Occlusion and stenosis of bilateral carotid arteries: Secondary | ICD-10-CM

## 2020-06-04 ENCOUNTER — Encounter (INDEPENDENT_AMBULATORY_CARE_PROVIDER_SITE_OTHER): Payer: Self-pay | Admitting: Vascular Surgery

## 2020-06-20 ENCOUNTER — Other Ambulatory Visit: Payer: Self-pay

## 2020-06-20 ENCOUNTER — Ambulatory Visit (INDEPENDENT_AMBULATORY_CARE_PROVIDER_SITE_OTHER): Payer: Medicare Other

## 2020-06-20 ENCOUNTER — Encounter (INDEPENDENT_AMBULATORY_CARE_PROVIDER_SITE_OTHER): Payer: Self-pay | Admitting: Nurse Practitioner

## 2020-06-20 ENCOUNTER — Ambulatory Visit (INDEPENDENT_AMBULATORY_CARE_PROVIDER_SITE_OTHER): Payer: Medicare Other | Admitting: Nurse Practitioner

## 2020-06-20 VITALS — BP 141/74 | HR 64 | Resp 16 | Wt 177.0 lb

## 2020-06-20 DIAGNOSIS — I70213 Atherosclerosis of native arteries of extremities with intermittent claudication, bilateral legs: Secondary | ICD-10-CM

## 2020-06-20 DIAGNOSIS — I6523 Occlusion and stenosis of bilateral carotid arteries: Secondary | ICD-10-CM | POA: Diagnosis not present

## 2020-06-20 DIAGNOSIS — Z720 Tobacco use: Secondary | ICD-10-CM | POA: Diagnosis not present

## 2020-06-20 DIAGNOSIS — I10 Essential (primary) hypertension: Secondary | ICD-10-CM | POA: Diagnosis not present

## 2020-06-21 ENCOUNTER — Encounter (INDEPENDENT_AMBULATORY_CARE_PROVIDER_SITE_OTHER): Payer: Self-pay | Admitting: Nurse Practitioner

## 2020-06-21 NOTE — Progress Notes (Signed)
Subjective:    Patient ID: Erin Trujillo, female    DOB: Nov 25, 1945, 75 y.o.   MRN: [id number] Chief Complaint  Patient presents with  . Follow-up    Ultrasound follow up    Erin Trujillo is a 75 year old female that presents today for follow-up of her peripheral arterial disease as well as surveillance for her carotid artery stenosis.  The patient underwent aortoiliac intervention on 08/09/2019.  Since that time the patient's claudication symptoms have ceased although she does continue to have some unsteadiness.  The patient has also stopped smoking.  She denies any TIA or CVA-like symptoms.  She denies any amaurosis fugax.  Overall she has been doing well.  Today the right ABI is 1.02 and the left is 1.04 the patient has triphasic tibial artery waveforms bilaterally with good toe waveforms.  The patient has 1 to 39% carotid artery stenosis bilaterally.  The patient has antegrade flow in her bilateral vertebral arteries with normal flow hemodynamics in the bilateral subclavian arteries.   Review of Systems  Neurological: Positive for weakness.  All other systems reviewed and are negative.      Objective:   Physical Exam Vitals reviewed.  HENT:     Head: Normocephalic.  Cardiovascular:     Rate and Rhythm: Normal rate.     Pulses: Normal pulses.  Pulmonary:     Effort: Pulmonary effort is normal.  Skin:    General: Skin is warm and dry.  Neurological:     Mental Status: She is alert and oriented to person, place, and time.     Motor: Weakness present.  Psychiatric:        Mood and Affect: Mood normal.        Behavior: Behavior normal.        Thought Content: Thought content normal.        Judgment: Judgment normal.     BP (!) 141/74 (BP Location: Right Arm)   Pulse 64   Resp 16   Wt 177 lb (80.3 kg)   BMI 30.38 kg/m   Past Medical History:  Diagnosis Date  . Arthritis   . COPD (chronic obstructive pulmonary disease) ([place])   . Hyperlipidemia   .  Hypertension   . Squamous cell skin cancer     Social History   Socioeconomic History  . Marital status: Divorced    Spouse name: Not on file  . Number of children: Not on file  . Years of education: Not on file  . Highest education level: Not on file  Occupational History  . Not on file  Tobacco Use  . Smoking status: Never Smoker  . Smokeless tobacco: Never Used  Substance and Sexual Activity  . Alcohol use: No  . Drug use: Never  . Sexual activity: Not on file  Other Topics Concern  . Not on file  Social History Narrative  . Not on file   Social Determinants of Health   Financial Resource Strain: Not on file  Food Insecurity: Not on file  Transportation Needs: Not on file  Physical Activity: Not on file  Stress: Not on file  Social Connections: Not on file  Intimate Partner Violence: Not on file    Past Surgical History:  Procedure Laterality Date  . ABDOMINAL HYSTERECTOMY    . APPENDECTOMY    . CHOLECYSTECTOMY    . LOWER EXTREMITY ANGIOGRAPHY Right 08/09/2019   Procedure: LOWER EXTREMITY ANGIOGRAPHY;  Surgeon: Algernon Huxley, MD;  Location: [facility];  Service: Cardiovascular;  Laterality: Right;  . spine cyst removal    . TONSILLECTOMY      Family History  Problem Relation Age of Onset  . Arthritis Mother   . Anxiety disorder Mother   . Osteoporosis Mother   . Hypertension Father   . Stroke Father   . Cancer Son   . Breast cancer Neg Hx     Allergies  Allergen Reactions  . Ciprofloxacin Nausea Only    CBC Latest Ref Rng & Units 09/04/2019  WBC 4.0 - 10.5 K/uL 8.6  Hemoglobin 12.0 - 15.0 g/dL 13.7  Hematocrit 36.0 - 46.0 % 40.9  Platelets 150 - 400 K/uL 321      CMP     Component Value Date/Time   NA 132 (L) 09/04/2019 0036   K 3.6 09/04/2019 0036   CL 92 (L) 09/04/2019 0036   CO2 25 09/04/2019 0036   GLUCOSE 112 (H) 09/04/2019 0036   BUN 19 09/04/2019 0036   CREATININE 0.79 09/04/2019 0036   CALCIUM 9.3 09/04/2019 0036    GFRNONAA >60 09/04/2019 0036   GFRAA >60 09/04/2019 0036     No results found.     Assessment & Plan:   1. Atherosclerosis of native artery of both lower extremities with intermittent claudication (HCC)  Recommend:  The patient has evidence of atherosclerosis of the lower extremities with claudication.  The patient does not voice lifestyle limiting changes at this point in time.  Noninvasive studies do not suggest clinically significant change.  No invasive studies, angiography or surgery at this time The patient should continue walking and begin a more formal exercise program.  The patient should continue antiplatelet therapy and aggressive treatment of the lipid abnormalities  No changes in the patient's medications at this time  The patient should continue wearing graduated compression socks 10-15 mmHg strength to control the mild edema.   Will follow-up in 1 year with noninvasive studies 2. Bilateral carotid artery stenosis Recommend:  Given the patient's asymptomatic subcritical stenosis no further invasive testing or surgery at this time.  Duplex ultrasound shows <40% stenosis bilaterally.  Continue antiplatelet therapy as prescribed Continue management of CAD, HTN and Hyperlipidemia Healthy heart diet,  encouraged exercise at least 4 times per week Follow up in 12 months with duplex ultrasound and physical exam   3. Essential hypertension Continue antihypertensive medications as already ordered, these medications have been reviewed and there are no changes at this time.   4. Tobacco abuse The patient has stopped smoking.  Cessation is encouraged   Current Outpatient Medications on File Prior to Visit  Medication Sig Dispense Refill  . acetaminophen (TYLENOL) 500 MG tablet Take 500 mg by mouth.    Marland Kitchen amLODipine (NORVASC) 5 MG tablet Take 5 mg by mouth daily.    . ARIPiprazole (ABILIFY) 5 MG tablet Take by mouth.    Marland Kitchen aspirin EC 81 MG tablet Take 1 tablet (81 mg  total) by mouth daily. 150 tablet 2  . COMBIVENT RESPIMAT 20-100 MCG/ACT AERS respimat Inhale 1 puff into the lungs 4 (four) times daily.    . diclofenac sodium (VOLTAREN) 1 % GEL Apply topically.    . DULoxetine (CYMBALTA) 60 MG capsule Take 60 mg by mouth daily.    Marland Kitchen ELIQUIS 5 MG TABS tablet 5 mg 2 (two) times daily.   4  . losartan-hydrochlorothiazide (HYZAAR) 100-25 MG tablet Take 1 tablet by mouth daily.    . metoprolol succinate (TOPROL-XL) 100 MG 24  hr tablet Take 100 mg by mouth daily.    . mupirocin ointment (BACTROBAN) 2 % SMARTSIG:1 Application Topical 2-3 Times Daily    . simvastatin (ZOCOR) 40 MG tablet Take 40 mg by mouth daily at 6 PM.   0  . buPROPion (WELLBUTRIN XL) 300 MG 24 hr tablet Take 300 mg by mouth.  (Patient not taking: No sig reported)    . cephALEXin (KEFLEX) 500 MG capsule Take 500 mg by mouth 3 (three) times daily. (Patient not taking: Reported on 06/20/2020)    . primidone (MYSOLINE) 50 MG tablet Take 50 mg by mouth 2 (two) times daily.     No current facility-administered medications on file prior to visit.    There are no Patient Instructions on file for this visit. No follow-ups on file.   Kris Hartmann, NP

## 2020-07-03 ENCOUNTER — Other Ambulatory Visit: Payer: Self-pay | Admitting: Internal Medicine

## 2020-07-03 DIAGNOSIS — Z1231 Encounter for screening mammogram for malignant neoplasm of breast: Secondary | ICD-10-CM

## 2020-08-05 ENCOUNTER — Encounter: Payer: Self-pay | Admitting: *Deleted

## 2020-08-05 ENCOUNTER — Telehealth: Payer: Self-pay | Admitting: *Deleted

## 2020-08-05 DIAGNOSIS — Z87891 Personal history of nicotine dependence: Secondary | ICD-10-CM

## 2020-08-05 DIAGNOSIS — Z122 Encounter for screening for malignant neoplasm of respiratory organs: Secondary | ICD-10-CM

## 2020-08-05 NOTE — Telephone Encounter (Addendum)
Received referral for low dose lung cancer screening CT scan. Message left at phone number listed in EMR for patient to call me back to facilitate scheduling scan.    Received referral for initial lung cancer screening scan. Contacted patient and obtained smoking history,(former, quit 06/08/20, 48 pack years) as well as answering questions related to screening process. Patient denies signs of lung cancer such as weight loss or hemoptysis. Patient denies comorbidity that would prevent curative treatment if lung cancer were found. Patient is scheduled for CT scan on 08/14/20 at 330pm.

## 2020-08-05 NOTE — Addendum Note (Signed)
Addended by: Lieutenant Diego on: 08/05/2020 03:07 PM   Modules accepted: Orders

## 2020-08-14 ENCOUNTER — Ambulatory Visit
Admission: RE | Admit: 2020-08-14 | Discharge: 2020-08-14 | Disposition: A | Discharge status: Home / Self Care | Payer: Medicare Other | Source: Ambulatory Visit | Attending: Nurse Practitioner | Admitting: Nurse Practitioner

## 2020-08-14 ENCOUNTER — Other Ambulatory Visit: Payer: Self-pay

## 2020-08-14 DIAGNOSIS — Z87891 Personal history of nicotine dependence: Secondary | ICD-10-CM | POA: Diagnosis not present

## 2020-08-14 DIAGNOSIS — Z122 Encounter for screening for malignant neoplasm of respiratory organs: Secondary | ICD-10-CM

## 2020-08-21 ENCOUNTER — Encounter: Payer: Self-pay | Admitting: *Deleted

## 2020-11-10 ENCOUNTER — Other Ambulatory Visit: Payer: Self-pay

## 2020-11-10 ENCOUNTER — Encounter: Payer: Medicare Other | Attending: Pulmonary Disease | Admitting: *Deleted

## 2020-11-10 DIAGNOSIS — J449 Chronic obstructive pulmonary disease, unspecified: Secondary | ICD-10-CM | POA: Insufficient documentation

## 2020-11-10 NOTE — Progress Notes (Signed)
Initial telephone orientation completed. Diagnosis can be found in [facility] 8/31. EP orientation scheduled for Thursday 10/13 at 2pm.

## 2020-11-20 ENCOUNTER — Other Ambulatory Visit: Payer: Self-pay

## 2020-11-20 DIAGNOSIS — J449 Chronic obstructive pulmonary disease, unspecified: Secondary | ICD-10-CM | POA: Diagnosis not present

## 2020-11-20 NOTE — Progress Notes (Signed)
Pulmonary Individual Treatment Plan  Patient Details  Name: Erin Trujillo MRN: [id number] Date of Birth: 07-Jul-1945 Referring Provider:   April Manson Pulmonary Rehab from 11/20/2020 in [facility] Cardiac and Pulmonary Rehab  Referring Provider Aleskerov       Initial Encounter Date:  Flowsheet Row Pulmonary Rehab from 11/20/2020 in [facility] Cardiac and Pulmonary Rehab  Date 11/20/20       Visit Diagnosis: Chronic obstructive pulmonary disease, unspecified COPD type ([place])  Patient's Home Medications on Admission:  Current Outpatient Medications:    acetaminophen (TYLENOL) 500 MG tablet, Take 500 mg by mouth., Disp: , Rfl:    amLODipine (NORVASC) 2.5 MG tablet, Take by mouth., Disp: , Rfl:    amLODipine (NORVASC) 5 MG tablet, Take 2.5 mg by mouth daily. (Patient not taking: Reported on 11/10/2020), Disp: , Rfl:    ARIPiprazole (ABILIFY) 5 MG tablet, Take by mouth., Disp: , Rfl:    aspirin EC 81 MG tablet, Take 1 tablet (81 mg total) by mouth daily., Disp: 150 tablet, Rfl: 2   buPROPion (WELLBUTRIN XL) 300 MG 24 hr tablet, Take 300 mg by mouth.  (Patient not taking: No sig reported), Disp: , Rfl:    cephALEXin (KEFLEX) 500 MG capsule, Take 500 mg by mouth 3 (three) times daily. (Patient not taking: No sig reported), Disp: , Rfl:    COMBIVENT RESPIMAT 20-100 MCG/ACT AERS respimat, Inhale 1 puff into the lungs 4 (four) times daily. (Patient not taking: Reported on 11/10/2020), Disp: , Rfl:    diclofenac sodium (VOLTAREN) 1 % GEL, Apply topically. (Patient not taking: Reported on 11/10/2020), Disp: , Rfl:    DULoxetine (CYMBALTA) 60 MG capsule, Take 60 mg by mouth daily., Disp: , Rfl:    ELIQUIS 5 MG TABS tablet, 5 mg 2 (two) times daily. , Disp: , Rfl: 4   ipratropium (ATROVENT) 0.06 % nasal spray, Place into the nose., Disp: , Rfl:    levalbuterol (XOPENEX HFA) 45 MCG/ACT inhaler, Inhale into the lungs., Disp: , Rfl:    losartan-hydrochlorothiazide (HYZAAR) 100-25 MG tablet, Take 1 tablet by  mouth daily., Disp: , Rfl:    metoprolol succinate (TOPROL-XL) 100 MG 24 hr tablet, Take 100 mg by mouth daily., Disp: , Rfl:    Multiple Vitamin (MULTIVITAMIN) capsule, Take 1 capsule by mouth daily., Disp: , Rfl:    mupirocin ointment (BACTROBAN) 2 %, SMARTSIG:1 Application Topical 2-3 Times Daily, Disp: , Rfl:    primidone (MYSOLINE) 50 MG tablet, Take 50 mg by mouth 2 (two) times daily., Disp: , Rfl:    simvastatin (ZOCOR) 40 MG tablet, Take 40 mg by mouth daily at 6 PM. , Disp: , Rfl: 0  Past Medical History: Past Medical History:  Diagnosis Date   Arthritis    COPD (chronic obstructive pulmonary disease) ([place])    Hyperlipidemia    Hypertension    Squamous cell skin cancer     Tobacco Use: Social History   Tobacco Use  Smoking Status Former   Packs/day: 0.75   Years: 48.00   Pack years: 36.00   Types: Cigarettes   Quit date: 06/08/2020   Years since quitting: 0.4  Smokeless Tobacco Former    Labs: Recent Chemical engineer   There is no flowsheet data to display.      Pulmonary Assessment Scores:  Pulmonary Assessment Scores     Row Name 11/20/20 1513         ADL UCSD   SOB Score total 59     Rest  0     Walk 0     Stairs 5     Bath 2     Dress 2     Shop 3           CAT Score   CAT Score 16           mMRC Score   mMRC Score 2              UCSD: Self-administered rating of dyspnea associated with activities of daily living (ADLs) 6-point scale (0 = "not at all" to 5 = "maximal or unable to do because of breathlessness")  Scoring Scores range from 0 to 120.  Minimally important difference is 5 units  CAT: CAT can identify the health impairment of COPD patients and is better correlated with disease progression.  CAT has a scoring range of zero to 40. The CAT score is classified into four groups of low (less than 10), medium (10 - 20), high (21-30) and very high (31-40) based on the impact level of disease on health status. A CAT score over  10 suggests significant symptoms.  A worsening CAT score could be explained by an exacerbation, poor medication adherence, poor inhaler technique, or progression of COPD or comorbid conditions.  CAT MCID is 2 points  mMRC: mMRC (Modified Medical Research Council) Dyspnea Scale is used to assess the degree of baseline functional disability in patients of respiratory disease due to dyspnea. No minimal important difference is established. A decrease in score of 1 point or greater is considered a positive change.   Pulmonary Function Assessment:   Exercise Target Goals: Exercise Program Goal: Individual exercise prescription set using results from initial 6 min walk test and THRR while considering  patient's activity barriers and safety.   Exercise Prescription Goal: Initial exercise prescription builds to 30-45 minutes a day of aerobic activity, 2-3 days per week.  Home exercise guidelines will be given to patient during program as part of exercise prescription that the participant will acknowledge.  Education: Aerobic Exercise: - Group verbal and visual presentation on the components of exercise prescription. Introduces F.I.T.T principle from ACSM for exercise prescriptions.  Reviews F.I.T.T. principles of aerobic exercise including progression. Written material given at graduation.   Education: Resistance Exercise: - Group verbal and visual presentation on the components of exercise prescription. Introduces F.I.T.T principle from ACSM for exercise prescriptions  Reviews F.I.T.T. principles of resistance exercise including progression. Written material given at graduation.    Education: Exercise & Equipment Safety: - Individual verbal instruction and demonstration of equipment use and safety with use of the equipment. Flowsheet Row Pulmonary Rehab from 11/20/2020 in [facility] Cardiac and Pulmonary Rehab  Date 11/20/20  Educator AS  Instruction Review Code 1- Verbalizes Understanding        Education: Exercise Physiology & General Exercise Guidelines: - Group verbal and written instruction with models to review the exercise physiology of the cardiovascular system and associated critical values. Provides general exercise guidelines with specific guidelines to those with heart or lung disease.    Education: Flexibility, Balance, Mind/Body Relaxation: - Group verbal and visual presentation with interactive activity on the components of exercise prescription. Introduces F.I.T.T principle from ACSM for exercise prescriptions. Reviews F.I.T.T. principles of flexibility and balance exercise training including progression. Also discusses the mind body connection.  Reviews various relaxation techniques to help reduce and manage stress (i.e. Deep breathing, progressive muscle relaxation, and visualization). Balance handout provided to take home. Written material given at graduation.  Activity Barriers & Risk Stratification:  Activity Barriers & Cardiac Risk Stratification - 11/10/20 1406       Activity Barriers & Cardiac Risk Stratification   Activity Barriers Balance Concerns;Arthritis    Comments walker             6 Minute Walk:  6 Minute Walk     Row Name 11/20/20 1505         6 Minute Walk   Phase Initial     Distance 640 feet     Walk Time 5.5 minutes     # of Rest Breaks 1     MPH 1.3     METS 1.4     RPE 15     Perceived Dyspnea  2     VO2 Peak 4.9     Symptoms Yes (comment)     Comments SOB     Resting HR 69 bpm     Resting BP 116/68     Resting Oxygen Saturation  95 %     Exercise Oxygen Saturation  during 6 min walk 87 %     Max Ex. HR 93 bpm     Max Ex. BP 152/70     2 Minute Post BP 110/74           Interval HR   1 Minute HR 81     2 Minute HR 68     3 Minute HR 93     4 Minute HR 89     5 Minute HR 89     6 Minute HR 68     2 Minute Post HR 69     Interval Heart Rate? Yes           Interval Oxygen   Interval Oxygen? Yes      Baseline Oxygen Saturation % 95 %     1 Minute Oxygen Saturation % 87 %     1 Minute Liters of Oxygen 3 L  pulsed     2 Minute Oxygen Saturation % 94 %     2 Minute Liters of Oxygen 3 L  pulsed     3 Minute Oxygen Saturation % 95 %     3 Minute Liters of Oxygen 3 L  pulsed     4 Minute Oxygen Saturation % 95 %     4 Minute Liters of Oxygen 3 L  pulsed     5 Minute Oxygen Saturation % 97 %     5 Minute Liters of Oxygen 3 L  pulsed     6 Minute Oxygen Saturation % 92 %     6 Minute Liters of Oxygen 3 L  pulsed     2 Minute Post Liters of Oxygen 3 L  pulsed             Oxygen Initial Assessment:  Oxygen Initial Assessment - 11/20/20 1518       Home Oxygen   Home Oxygen Device Portable Concentrator;Home Concentrator    Sleep Oxygen Prescription Continuous    Liters per minute 3    Home Exercise Oxygen Prescription Continuous    Liters per minute 3    Home Resting Oxygen Prescription Continuous    Liters per minute 3    Compliance with Home Oxygen Use Yes      Initial 6 min Walk   Oxygen Used Pulsed    Liters per minute 3      Program Oxygen Prescription   Program Oxygen  Prescription Continuous    Liters per minute 3      Intervention   Short Term Goals To learn and exhibit compliance with exercise, home and travel O2 prescription;To learn and understand importance of monitoring SPO2 with pulse oximeter and demonstrate accurate use of the pulse oximeter.;To learn and understand importance of maintaining oxygen saturations>88%;To learn and demonstrate proper pursed lip breathing techniques or other breathing techniques. ;To learn and demonstrate proper use of respiratory medications             Oxygen Re-Evaluation:   Oxygen Discharge (Final Oxygen Re-Evaluation):   Initial Exercise Prescription:  Initial Exercise Prescription - 11/20/20 1500       Date of Initial Exercise RX and Referring Provider   Date 11/20/20    Referring Provider Aleskerov      Oxygen    Oxygen Continuous    Liters 3    Maintain Oxygen Saturation 88% or higher      Arm Ergometer   Level 1    RPM 25    Minutes 15    METs 1.4      REL-XR   Level 1    Speed 50    Minutes 15    METs 1.4      Biostep-RELP   Level 1    SPM 50    Minutes 15    METs 1.4      Track   Laps 7    Minutes 15    METs 1.4      Prescription Details   Frequency (times per week) 3    Duration Progress to 30 minutes of continuous aerobic without signs/symptoms of physical distress      Intensity   THRR 40-80% of Max Heartrate 99-130    Ratings of Perceived Exertion 11-15    Perceived Dyspnea 0-4      Progression   Progression Continue to progress workloads to maintain intensity without signs/symptoms of physical distress.      Resistance Training   Training Prescription Yes    Weight 3 lb    Reps 10-15             Perform Capillary Blood Glucose checks as needed.  Exercise Prescription Changes:   Exercise Prescription Changes     Row Name 11/20/20 1500             Response to Exercise   Blood Pressure (Admit) 116/68       Blood Pressure (Exercise) 152/70       Blood Pressure (Exit) 110/74       Heart Rate (Admit) 69 bpm       Heart Rate (Exercise) 89 bpm       Heart Rate (Exit) 69 bpm       Oxygen Saturation (Admit) 95 %       Oxygen Saturation (Exercise) 87 %       Oxygen Saturation (Exit) 99 %       Rating of Perceived Exertion (Exercise) 15       Perceived Dyspnea (Exercise) 2       Symptoms SOB                Exercise Comments:   Exercise Goals and Review:   Exercise Goals     Row Name 11/20/20 1512             Exercise Goals   Increase Physical Activity Yes       Intervention Provide advice, education, support and counseling  about physical activity/exercise needs.;Develop an individualized exercise prescription for aerobic and resistive training based on initial evaluation findings, risk stratification, comorbidities and  participant's personal goals.       Expected Outcomes Short Term: Attend rehab on a regular basis to increase amount of physical activity.;Long Term: Add in home exercise to make exercise part of routine and to increase amount of physical activity.;Long Term: Exercising regularly at least 3-5 days a week.       Increase Strength and Stamina Yes       Intervention Provide advice, education, support and counseling about physical activity/exercise needs.;Develop an individualized exercise prescription for aerobic and resistive training based on initial evaluation findings, risk stratification, comorbidities and participant's personal goals.       Expected Outcomes Short Term: Increase workloads from initial exercise prescription for resistance, speed, and METs.;Short Term: Perform resistance training exercises routinely during rehab and add in resistance training at home;Long Term: Improve cardiorespiratory fitness, muscular endurance and strength as measured by increased METs and functional capacity (6MWT)       Able to understand and use rate of perceived exertion (RPE) scale Yes       Intervention Provide education and explanation on how to use RPE scale       Expected Outcomes Short Term: Able to use RPE daily in rehab to express subjective intensity level;Long Term:  Able to use RPE to guide intensity level when exercising independently       Able to understand and use Dyspnea scale Yes       Intervention Provide education and explanation on how to use Dyspnea scale       Expected Outcomes Short Term: Able to use Dyspnea scale daily in rehab to express subjective sense of shortness of breath during exertion;Long Term: Able to use Dyspnea scale to guide intensity level when exercising independently       Knowledge and understanding of Target Heart Rate Range (THRR) Yes       Intervention Provide education and explanation of THRR including how the numbers were predicted and where they are located for  reference       Expected Outcomes Short Term: Able to state/look up THRR;Short Term: Able to use daily as guideline for intensity in rehab;Long Term: Able to use THRR to govern intensity when exercising independently       Able to check pulse independently Yes       Intervention Provide education and demonstration on how to check pulse in carotid and radial arteries.;Review the importance of being able to check your own pulse for safety during independent exercise       Expected Outcomes Short Term: Able to explain why pulse checking is important during independent exercise;Long Term: Able to check pulse independently and accurately       Understanding of Exercise Prescription Yes       Intervention Provide education, explanation, and written materials on patient's individual exercise prescription       Expected Outcomes Short Term: Able to explain program exercise prescription;Long Term: Able to explain home exercise prescription to exercise independently                Exercise Goals Re-Evaluation :   Discharge Exercise Prescription (Final Exercise Prescription Changes):  Exercise Prescription Changes - 11/20/20 1500       Response to Exercise   Blood Pressure (Admit) 116/68    Blood Pressure (Exercise) 152/70    Blood Pressure (Exit) 110/74    Heart Rate (  Admit) 69 bpm    Heart Rate (Exercise) 89 bpm    Heart Rate (Exit) 69 bpm    Oxygen Saturation (Admit) 95 %    Oxygen Saturation (Exercise) 87 %    Oxygen Saturation (Exit) 99 %    Rating of Perceived Exertion (Exercise) 15    Perceived Dyspnea (Exercise) 2    Symptoms SOB             Nutrition:  Target Goals: Understanding of nutrition guidelines, daily intake of sodium 1500mg , cholesterol 200mg , calories 30% from fat and 7% or less from saturated fats, daily to have 5 or more servings of fruits and vegetables.  Education: All About Nutrition: -Group instruction provided by verbal, written material, interactive  activities, discussions, models, and posters to present general guidelines for heart healthy nutrition including fat, fiber, MyPlate, the role of sodium in heart healthy nutrition, utilization of the nutrition label, and utilization of this knowledge for meal planning. Follow up email sent as well. Written material given at graduation.   Biometrics:    Nutrition Therapy Plan and Nutrition Goals:  Nutrition Therapy & Goals - 11/10/20 1432       Personal Nutrition Goals   Comments She gets lunch at her retirement facility, but lately hasn't been going due to the heat. She doesn't feel comfortable cooking big meals for herself because her walker and oxygen tubing get tangled up in the kitchen. She mainly has been eating tuna fish salad.             Nutrition Assessments:  MEDIFICTS Score Key: ?70 Need to make dietary changes  40-70 Heart Healthy Diet ? 40 Therapeutic Level Cholesterol Diet  Flowsheet Row Pulmonary Rehab from 11/20/2020 in [facility] Cardiac and Pulmonary Rehab  Picture Your Plate Total Score on Admission 66      Picture Your Plate Scores: <40 Unhealthy dietary pattern with much room for improvement. 41-50 Dietary pattern unlikely to meet recommendations for good health and room for improvement. 51-60 More healthful dietary pattern, with some room for improvement.  >60 Healthy dietary pattern, although there may be some specific behaviors that could be improved.   Nutrition Goals Re-Evaluation:   Nutrition Goals Discharge (Final Nutrition Goals Re-Evaluation):   Psychosocial: Target Goals: Acknowledge presence or absence of significant depression and/or stress, maximize coping skills, provide positive support system. Participant is able to verbalize types and ability to use techniques and skills needed for reducing stress and depression.   Education: Stress, Anxiety, and Depression - Group verbal and visual presentation to define topics covered.  Reviews how  body is impacted by stress, anxiety, and depression.  Also discusses healthy ways to reduce stress and to treat/manage anxiety and depression.  Written material given at graduation.   Education: Sleep Hygiene -Provides group verbal and written instruction about how sleep can affect your health.  Define sleep hygiene, discuss sleep cycles and impact of sleep habits. Review good sleep hygiene tips.    Initial Review & Psychosocial Screening:  Initial Psych Review & Screening - 11/10/20 1411       Initial Review   Current issues with Current Sleep Concerns;History of Depression;Current Depression      Family Dynamics   Good Support System? Yes   2 sons and their wives, grandson, next door neighbor, 2 out of town friends     Barriers   Psychosocial barriers to participate in program The patient should benefit from training in stress management and relaxation.;There are no identifiable barriers or  psychosocial needs.      Screening Interventions   Interventions To provide support and resources with identified psychosocial needs;Encouraged to exercise;Provide feedback about the scores to participant    Expected Outcomes Short Term goal: Utilizing psychosocial counselor, staff and physician to assist with identification of specific Stressors or current issues interfering with healing process. Setting desired goal for each stressor or current issue identified.;Long Term Goal: Stressors or current issues are controlled or eliminated.;Short Term goal: Identification and review with participant of any Quality of Life or Depression concerns found by scoring the questionnaire.;Long Term goal: The participant improves quality of Life and PHQ9 Scores as seen by post scores and/or verbalization of changes             Quality of Life Scores:  Scores of 19 and below usually indicate a poorer quality of life in these areas.  A difference of  2-3 points is a clinically meaningful difference.  A  difference of 2-3 points in the total score of the Quality of Life Index has been associated with significant improvement in overall quality of life, self-image, physical symptoms, and general health in studies assessing change in quality of life.  PHQ-9: Recent Review Flowsheet Data     Depression screen [facility] 2/9 11/20/2020   Decreased Interest 1   Down, Depressed, Hopeless 1   PHQ - 2 Score 2   Altered sleeping 2   Tired, decreased energy 2   Change in appetite 1   Feeling bad or failure about yourself  0   Trouble concentrating 2   Moving slowly or fidgety/restless 0   Suicidal thoughts 0   PHQ-9 Score 9   Difficult doing work/chores Not difficult at all      Interpretation of Total Score  Total Score Depression Severity:  1-4 = Minimal depression, 5-9 = Mild depression, 10-14 = Moderate depression, 15-19 = Moderately severe depression, 20-27 = Severe depression   Psychosocial Evaluation and Intervention:  Psychosocial Evaluation - 11/10/20 1428       Psychosocial Evaluation & Interventions   Interventions Encouraged to exercise with the program and follow exercise prescription;Stress management education;Relaxation education    Comments Bethena Roys is coming to pulmonary rehab due to worsening breathing issues. She does wear oxygen and sometimes struggles breathing through her nose which leads to dropped oxygen saturation that leads to dizziness. She is trying to work on her breathing techniques. She lives in a retirement community that she enjoys. Her sons are close by and one grandson comes weekly to help her with trash,groceries, etc. She is very thankful for her support system. Her balance issues and breathing difficulties make it harder on her to cook like she used to and enjoy other activities, but she is learning to pace herself. She does have a history of depression and states that currently she is feeling well and just works harder to find her happiness on the bad days     Expected Outcomes Short: attend pulmonary rehab for education and exercise. Long: develop and maintain positive self care habits.    Continue Psychosocial Services  Follow up required by staff             Psychosocial Re-Evaluation:   Psychosocial Discharge (Final Psychosocial Re-Evaluation):   Education: Education Goals: Education classes will be provided on a weekly basis, covering required topics. Participant will state understanding/return demonstration of topics presented.  Learning Barriers/Preferences:  Learning Barriers/Preferences - 11/10/20 1407       Learning Barriers/Preferences  Learning Barriers None    Learning Preferences None             General Pulmonary Education Topics:  Infection Prevention: - Provides verbal and written material to individual with discussion of infection control including proper hand washing and proper equipment cleaning during exercise session. Flowsheet Row Pulmonary Rehab from 11/20/2020 in [facility] Cardiac and Pulmonary Rehab  Date 11/20/20  Educator AS  Instruction Review Code 1- Verbalizes Understanding       Falls Prevention: - Provides verbal and written material to individual with discussion of falls prevention and safety. Flowsheet Row Pulmonary Rehab from 11/20/2020 in [facility] Cardiac and Pulmonary Rehab  Date 11/20/20  Educator AS  Instruction Review Code 1- Verbalizes Understanding       Chronic Lung Disease Review: - Group verbal instruction with posters, models, PowerPoint presentations and videos,  to review new updates, new respiratory medications, new advancements in procedures and treatments. Providing information on websites and "800" numbers for continued self-education. Includes information about supplement oxygen, available portable oxygen systems, continuous and intermittent flow rates, oxygen safety, concentrators, and Medicare reimbursement for oxygen. Explanation of Pulmonary Drugs, including class,  frequency, complications, importance of spacers, rinsing mouth after steroid MDI's, and proper cleaning methods for nebulizers. Review of basic lung anatomy and physiology related to function, structure, and complications of lung disease. Review of risk factors. Discussion about methods for diagnosing sleep apnea and types of masks and machines for OSA. Includes a review of the use of types of environmental controls: home humidity, furnaces, filters, dust mite/pet prevention, HEPA vacuums. Discussion about weather changes, air quality and the benefits of nasal washing. Instruction on Warning signs, infection symptoms, calling MD promptly, preventive modes, and value of vaccinations. Review of effective airway clearance, coughing and/or vibration techniques. Emphasizing that all should Create an Action Plan. Written material given at graduation. Flowsheet Row Pulmonary Rehab from 11/20/2020 in [facility] Cardiac and Pulmonary Rehab  Education need identified 11/20/20       AED/CPR: - Group verbal and written instruction with the use of models to demonstrate the basic use of the AED with the basic ABC's of resuscitation.    Anatomy and Cardiac Procedures: - Group verbal and visual presentation and models provide information about basic cardiac anatomy and function. Reviews the testing methods done to diagnose heart disease and the outcomes of the test results. Describes the treatment choices: Medical Management, Angioplasty, or Coronary Bypass Surgery for treating various heart conditions including Myocardial Infarction, Angina, Valve Disease, and Cardiac Arrhythmias.  Written material given at graduation.   Medication Safety: - Group verbal and visual instruction to review commonly prescribed medications for heart and lung disease. Reviews the medication, class of the drug, and side effects. Includes the steps to properly store meds and maintain the prescription regimen.  Written material given at  graduation.   Other: -Provides group and verbal instruction on various topics (see comments)   Knowledge Questionnaire Score:  Knowledge Questionnaire Score - 11/20/20 1515       Knowledge Questionnaire Score   Pre Score 15/18              Core Components/Risk Factors/Patient Goals at Admission:  Personal Goals and Risk Factors at Admission - 11/20/20 1517       Core Components/Risk Factors/Patient Goals on Admission   Improve shortness of breath with ADL's Yes    Intervention Provide education, individualized exercise plan and daily activity instruction to help decrease symptoms of SOB with activities of daily  living.    Expected Outcomes Short Term: Improve cardiorespiratory fitness to achieve a reduction of symptoms when performing ADLs;Long Term: Be able to perform more ADLs without symptoms or delay the onset of symptoms    Hypertension Yes    Intervention Provide education on lifestyle modifcations including regular physical activity/exercise, weight management, moderate sodium restriction and increased consumption of fresh fruit, vegetables, and low fat dairy, alcohol moderation, and smoking cessation.;Monitor prescription use compliance.    Expected Outcomes Long Term: Maintenance of blood pressure at goal levels.;Short Term: Continued assessment and intervention until BP is < 140/11mm HG in hypertensive participants. < 130/80mm HG in hypertensive participants with diabetes, heart failure or chronic kidney disease.    Lipids Yes    Intervention Provide education and support for participant on nutrition & aerobic/resistive exercise along with prescribed medications to achieve LDL 70mg , HDL >40mg .    Expected Outcomes Short Term: Participant states understanding of desired cholesterol values and is compliant with medications prescribed. Participant is following exercise prescription and nutrition guidelines.;Long Term: Cholesterol controlled with medications as prescribed,  with individualized exercise RX and with personalized nutrition plan. Value goals: LDL < 70mg , HDL > 40 mg.             Education:Diabetes - Individual verbal and written instruction to review signs/symptoms of diabetes, desired ranges of glucose level fasting, after meals and with exercise. Acknowledge that pre and post exercise glucose checks will be done for 3 sessions at entry of program.   Know Your Numbers and Heart Failure: - Group verbal and visual instruction to discuss disease risk factors for cardiac and pulmonary disease and treatment options.  Reviews associated critical values for Overweight/Obesity, Hypertension, Cholesterol, and Diabetes.  Discusses basics of heart failure: signs/symptoms and treatments.  Introduces Heart Failure Zone chart for action plan for heart failure.  Written material given at graduation.   Core Components/Risk Factors/Patient Goals Review:    Core Components/Risk Factors/Patient Goals at Discharge (Final Review):    ITP Comments:  ITP Comments     Row Name 11/10/20 1416           ITP Comments Initial telephone orientation completed. Diagnosis can be found in [facility] 8/31. EP orientation scheduled for Thursday 10/13 at 2pm.                Comments: initial ITP

## 2020-11-20 NOTE — Patient Instructions (Signed)
Patient Instructions  Patient Details  Name: Erin Trujillo MRN: [id number] Date of Birth: 1945-08-03 Referring Provider:  Ottie Glazier, MD  Below are your personal goals for exercise, nutrition, and risk factors. Our goal is to help you stay on track towards obtaining and maintaining these goals. We will be discussing your progress on these goals with you throughout the program.  Initial Exercise Prescription:  Initial Exercise Prescription - 11/20/20 1500       Date of Initial Exercise RX and Referring Provider   Date 11/20/20    Referring Provider Aleskerov      Oxygen   Oxygen Continuous    Liters 3    Maintain Oxygen Saturation 88% or higher      Arm Ergometer   Level 1    RPM 25    Minutes 15    METs 1.4      REL-XR   Level 1    Speed 50    Minutes 15    METs 1.4      Biostep-RELP   Level 1    SPM 50    Minutes 15    METs 1.4      Track   Laps 7    Minutes 15    METs 1.4      Prescription Details   Frequency (times per week) 3    Duration Progress to 30 minutes of continuous aerobic without signs/symptoms of physical distress      Intensity   THRR 40-80% of Max Heartrate 99-130    Ratings of Perceived Exertion 11-15    Perceived Dyspnea 0-4      Progression   Progression Continue to progress workloads to maintain intensity without signs/symptoms of physical distress.      Resistance Training   Training Prescription Yes    Weight 3 lb    Reps 10-15             Exercise Goals: Frequency: Be able to perform aerobic exercise two to three times per week in program working toward 2-5 days per week of home exercise.  Intensity: Work with a perceived exertion of 11 (fairly light) - 15 (hard) while following your exercise prescription.  We will make changes to your prescription with you as you progress through the program.   Duration: Be able to do 30 to 45 minutes of continuous aerobic exercise in addition to a 5 minute warm-up and a 5 minute  cool-down routine.   Nutrition Goals: Your personal nutrition goals will be established when you do your nutrition analysis with the dietician.  The following are general nutrition guidelines to follow: Cholesterol < 200mg /day Sodium < 1500mg /day Fiber: Women under 50 yrs - 25 grams per day  Personal Goals:  Personal Goals and Risk Factors at Admission - 11/20/20 1517       Core Components/Risk Factors/Patient Goals on Admission   Improve shortness of breath with ADL's Yes    Intervention Provide education, individualized exercise plan and daily activity instruction to help decrease symptoms of SOB with activities of daily living.    Expected Outcomes Short Term: Improve cardiorespiratory fitness to achieve a reduction of symptoms when performing ADLs;Long Term: Be able to perform more ADLs without symptoms or delay the onset of symptoms    Hypertension Yes    Intervention Provide education on lifestyle modifcations including regular physical activity/exercise, weight management, moderate sodium restriction and increased consumption of fresh fruit, vegetables, and low fat dairy, alcohol moderation, and smoking cessation.;Monitor prescription  use compliance.    Expected Outcomes Long Term: Maintenance of blood pressure at goal levels.;Short Term: Continued assessment and intervention until BP is < 140/36mm HG in hypertensive participants. < 130/61mm HG in hypertensive participants with diabetes, heart failure or chronic kidney disease.    Lipids Yes    Intervention Provide education and support for participant on nutrition & aerobic/resistive exercise along with prescribed medications to achieve LDL 70mg , HDL >40mg .    Expected Outcomes Short Term: Participant states understanding of desired cholesterol values and is compliant with medications prescribed. Participant is following exercise prescription and nutrition guidelines.;Long Term: Cholesterol controlled with medications as prescribed,  with individualized exercise RX and with personalized nutrition plan. Value goals: LDL < 70mg , HDL > 40 mg.             Tobacco Use Initial Evaluation: Social History   Tobacco Use  Smoking Status Former   Packs/day: 0.75   Years: 48.00   Pack years: 36.00   Types: Cigarettes   Quit date: 06/08/2020   Years since quitting: 0.4  Smokeless Tobacco Former    Exercise Goals and Review:  Exercise Goals     Row Name 11/20/20 1512             Exercise Goals   Increase Physical Activity Yes       Intervention Provide advice, education, support and counseling about physical activity/exercise needs.;Develop an individualized exercise prescription for aerobic and resistive training based on initial evaluation findings, risk stratification, comorbidities and participant's personal goals.       Expected Outcomes Short Term: Attend rehab on a regular basis to increase amount of physical activity.;Long Term: Add in home exercise to make exercise part of routine and to increase amount of physical activity.;Long Term: Exercising regularly at least 3-5 days a week.       Increase Strength and Stamina Yes       Intervention Provide advice, education, support and counseling about physical activity/exercise needs.;Develop an individualized exercise prescription for aerobic and resistive training based on initial evaluation findings, risk stratification, comorbidities and participant's personal goals.       Expected Outcomes Short Term: Increase workloads from initial exercise prescription for resistance, speed, and METs.;Short Term: Perform resistance training exercises routinely during rehab and add in resistance training at home;Long Term: Improve cardiorespiratory fitness, muscular endurance and strength as measured by increased METs and functional capacity (6MWT)       Able to understand and use rate of perceived exertion (RPE) scale Yes       Intervention Provide education and explanation on how  to use RPE scale       Expected Outcomes Short Term: Able to use RPE daily in rehab to express subjective intensity level;Long Term:  Able to use RPE to guide intensity level when exercising independently       Able to understand and use Dyspnea scale Yes       Intervention Provide education and explanation on how to use Dyspnea scale       Expected Outcomes Short Term: Able to use Dyspnea scale daily in rehab to express subjective sense of shortness of breath during exertion;Long Term: Able to use Dyspnea scale to guide intensity level when exercising independently       Knowledge and understanding of Target Heart Rate Range (THRR) Yes       Intervention Provide education and explanation of THRR including how the numbers were predicted and where they are located for reference  Expected Outcomes Short Term: Able to state/look up THRR;Short Term: Able to use daily as guideline for intensity in rehab;Long Term: Able to use THRR to govern intensity when exercising independently       Able to check pulse independently Yes       Intervention Provide education and demonstration on how to check pulse in carotid and radial arteries.;Review the importance of being able to check your own pulse for safety during independent exercise       Expected Outcomes Short Term: Able to explain why pulse checking is important during independent exercise;Long Term: Able to check pulse independently and accurately       Understanding of Exercise Prescription Yes       Intervention Provide education, explanation, and written materials on patient's individual exercise prescription       Expected Outcomes Short Term: Able to explain program exercise prescription;Long Term: Able to explain home exercise prescription to exercise independently                Copy of goals given to participant.

## 2020-11-24 ENCOUNTER — Other Ambulatory Visit: Payer: Self-pay

## 2020-11-24 DIAGNOSIS — J449 Chronic obstructive pulmonary disease, unspecified: Secondary | ICD-10-CM

## 2020-11-24 NOTE — Progress Notes (Signed)
Daily Session Note  Patient Details  Name: ANGENI CHAUDHURI MRN: [id number] Date of Birth: 08-03-1945 Referring Provider:   April Manson Pulmonary Rehab from 11/20/2020 in [facility] Cardiac and Pulmonary Rehab  Referring Provider [name]       Encounter Date: 11/24/2020  Check In:  Session Check In - 11/24/20 1406       Check-In   Supervising physician immediately available to respond to emergencies See telemetry face sheet for immediately available ER MD    Location [facility]-Cardiac & Pulmonary Rehab    Staff Present Birdie Sons, MPA, Nino Glow, MS, ASCM CEP, Exercise Physiologist;Joseph Tessie Fass, [place]    Virtual Visit No    Medication changes reported     No    Fall or balance concerns reported    No    Warm-up and Cool-down Performed on first and last piece of equipment    Resistance Training Performed Yes    VAD Patient? No    PAD/SET Patient? No      Pain Assessment   Currently in Pain? No/denies                Social History   Tobacco Use  Smoking Status Former   Packs/day: 0.75   Years: 48.00   Pack years: 36.00   Types: Cigarettes   Quit date: 06/08/2020   Years since quitting: 0.4  Smokeless Tobacco Former    Goals Met:  Independence with exercise equipment Exercise tolerated well No report of concerns or symptoms today Strength training completed today  Goals Unmet:  Not Applicable  Comments: First full day of exercise!  Patient was oriented to gym and equipment including functions, settings, policies, and procedures.  Patient's individual exercise prescription and treatment plan were reviewed.  All starting workloads were established based on the results of the 6 minute walk test done at initial orientation visit.  The plan for exercise progression was also introduced and progression will be customized based on patient's performance and goals.     Dr. Emily Filbert is Medical Director for [place].  Dr. Ottie Glazier is Medical Director for [facility] Pulmonary Rehabilitation.

## 2020-11-26 ENCOUNTER — Other Ambulatory Visit: Payer: Self-pay

## 2020-11-26 DIAGNOSIS — J449 Chronic obstructive pulmonary disease, unspecified: Secondary | ICD-10-CM | POA: Diagnosis not present

## 2020-11-26 NOTE — Progress Notes (Signed)
Daily Session Note  Patient Details  Name: Erin Trujillo MRN: [id number] Date of Birth: 02/27/1945 Referring Provider:   April Manson Pulmonary Rehab from 11/20/2020 in [facility] Cardiac and Pulmonary Rehab  Referring Provider [name]       Encounter Date: 11/26/2020  Check In:  Session Check In - 11/26/20 1353       Check-In   Supervising physician immediately available to respond to emergencies See telemetry face sheet for immediately available ER MD    Location [facility]-Cardiac & Pulmonary Rehab    Staff Present Birdie Sons, MPA, RN;Melissa Bryan, RDN, LDN;Joseph [place], [place]    Virtual Visit No    Medication changes reported     No    Fall or balance concerns reported    No    Warm-up and Cool-down Performed on first and last piece of equipment    Resistance Training Performed Yes    VAD Patient? No    PAD/SET Patient? No      Pain Assessment   Currently in Pain? No/denies                Social History   Tobacco Use  Smoking Status Former   Packs/day: 0.75   Years: 48.00   Pack years: 36.00   Types: Cigarettes   Quit date: 06/08/2020   Years since quitting: 0.4  Smokeless Tobacco Former    Goals Met:  Independence with exercise equipment Exercise tolerated well No report of concerns or symptoms today Strength training completed today  Goals Unmet:  Not Applicable  Comments: Pt able to follow exercise prescription today without complaint.  Will continue to monitor for progression.    Dr. Emily Filbert is Medical Director for [place].  Dr. Ottie Glazier is Medical Director for [facility] Pulmonary Rehabilitation.

## 2020-11-27 ENCOUNTER — Encounter: Payer: Medicare Other | Admitting: *Deleted

## 2020-11-27 ENCOUNTER — Other Ambulatory Visit: Payer: Self-pay

## 2020-11-27 DIAGNOSIS — J449 Chronic obstructive pulmonary disease, unspecified: Secondary | ICD-10-CM

## 2020-11-27 NOTE — Progress Notes (Signed)
Daily Session Note  Patient Details  Name: Erin Trujillo MRN: [id number] Date of Birth: Aug 21, 1945 Referring Provider:   April Manson Pulmonary Rehab from 11/20/2020 in [facility] Cardiac and Pulmonary Rehab  Referring Provider [name]       Encounter Date: 11/27/2020  Check In:  Session Check In - 11/27/20 1343       Check-In   Supervising physician immediately available to respond to emergencies See telemetry face sheet for immediately available ER MD    Location [facility]-Cardiac & Pulmonary Rehab    Staff Present Renita Papa, RN BSN;Joseph [place], RCP,RRT,BSRT;Jessica [place], [place], RCEP, CCRP, CCET    Virtual Visit No    Medication changes reported     No    Fall or balance concerns reported    No    Warm-up and Cool-down Performed on first and last piece of equipment    Resistance Training Performed Yes    VAD Patient? No    PAD/SET Patient? No      Pain Assessment   Currently in Pain? No/denies                Social History   Tobacco Use  Smoking Status Former   Packs/day: 0.75   Years: 48.00   Pack years: 36.00   Types: Cigarettes   Quit date: 06/08/2020   Years since quitting: 0.4  Smokeless Tobacco Former    Goals Met:  Independence with exercise equipment Exercise tolerated well No report of concerns or symptoms today Strength training completed today  Goals Unmet:  Not Applicable  Comments: Pt able to follow exercise prescription today without complaint.  Will continue to monitor for progression.    Dr. Emily Filbert is Medical Director for [place].  Dr. Ottie Glazier is Medical Director for [facility] Pulmonary Rehabilitation.

## 2020-12-03 ENCOUNTER — Other Ambulatory Visit: Payer: Self-pay

## 2020-12-03 DIAGNOSIS — J449 Chronic obstructive pulmonary disease, unspecified: Secondary | ICD-10-CM

## 2020-12-03 NOTE — Progress Notes (Signed)
Daily Session Note  Patient Details  Name: Erin Trujillo MRN: [id number] Date of Birth: 02/05/1946 Referring Provider:   April Manson Pulmonary Rehab from 11/20/2020 in [facility] Cardiac and Pulmonary Rehab  Referring Provider [name]       Encounter Date: 12/03/2020  Check In:  Session Check In - 12/03/20 1406       Check-In   Supervising physician immediately available to respond to emergencies See telemetry face sheet for immediately available ER MD    Location [facility]-Cardiac & Pulmonary Rehab    Staff Present Birdie Sons, MPA, Nino Glow, MS, ASCM CEP, Exercise Physiologist;Joseph Tessie Fass, [place]    Virtual Visit No    Medication changes reported     No    Fall or balance concerns reported    No    Warm-up and Cool-down Performed on first and last piece of equipment    Resistance Training Performed Yes    VAD Patient? No    PAD/SET Patient? No      Pain Assessment   Currently in Pain? No/denies                Social History   Tobacco Use  Smoking Status Former   Packs/day: 0.75   Years: 48.00   Pack years: 36.00   Types: Cigarettes   Quit date: 06/08/2020   Years since quitting: 0.4  Smokeless Tobacco Former    Goals Met:  Independence with exercise equipment Exercise tolerated well No report of concerns or symptoms today Strength training completed today  Goals Unmet:  Not Applicable  Comments: Pt able to follow exercise prescription today without complaint.  Will continue to monitor for progression.    Dr. Emily Filbert is Medical Director for [place].  Dr. Ottie Glazier is Medical Director for [facility] Pulmonary Rehabilitation.

## 2020-12-04 ENCOUNTER — Other Ambulatory Visit: Payer: Self-pay

## 2020-12-04 ENCOUNTER — Encounter: Payer: Medicare Other | Admitting: *Deleted

## 2020-12-04 DIAGNOSIS — J449 Chronic obstructive pulmonary disease, unspecified: Secondary | ICD-10-CM

## 2020-12-04 NOTE — Progress Notes (Signed)
Daily Session Note  Patient Details  Name: Erin Trujillo MRN: [id number] Date of Birth: 08/06/1945 Referring Provider:   April Manson Pulmonary Rehab from 11/20/2020 in [facility] Cardiac and Pulmonary Rehab  Referring Provider [name]       Encounter Date: 12/04/2020  Check In:  Session Check In - 12/04/20 1411       Check-In   Supervising physician immediately available to respond to emergencies See telemetry face sheet for immediately available ER MD    Location [facility]-Cardiac & Pulmonary Rehab    Staff Present Renita Papa, RN BSN;Joseph [place], RCP,RRT,BSRT;Jessica [place], [place], RCEP, CCRP, CCET    Virtual Visit No    Medication changes reported     No    Fall or balance concerns reported    No    Warm-up and Cool-down Performed on first and last piece of equipment    Resistance Training Performed Yes    VAD Patient? No    PAD/SET Patient? No      Pain Assessment   Currently in Pain? No/denies                Social History   Tobacco Use  Smoking Status Former   Packs/day: 0.75   Years: 48.00   Pack years: 36.00   Types: Cigarettes   Quit date: 06/08/2020   Years since quitting: 0.4  Smokeless Tobacco Former    Goals Met:  Independence with exercise equipment Exercise tolerated well No report of concerns or symptoms today Strength training completed today  Goals Unmet:  Not Applicable  Comments: Pt able to follow exercise prescription today without complaint.  Will continue to monitor for progression.    Dr. Emily Filbert is Medical Director for [place].  Dr. Ottie Glazier is Medical Director for [facility] Pulmonary Rehabilitation.

## 2020-12-10 ENCOUNTER — Encounter: Payer: Self-pay | Admitting: *Deleted

## 2020-12-10 DIAGNOSIS — J449 Chronic obstructive pulmonary disease, unspecified: Secondary | ICD-10-CM

## 2020-12-10 NOTE — Progress Notes (Signed)
Pulmonary Individual Treatment Plan  Patient Details  Name: Erin Trujillo MRN: [id number] Date of Birth: [birth year] Referring Provider:   April Manson Pulmonary Rehab from 11/20/2020 in [facility] Cardiac and Pulmonary Rehab  Referring Provider Aleskerov       Initial Encounter Date:  Flowsheet Row Pulmonary Rehab from 11/20/2020 in [facility] Cardiac and Pulmonary Rehab  Date 11/20/20       Visit Diagnosis: Chronic obstructive pulmonary disease, unspecified COPD type ([place])  Patient's Home Medications on Admission:  Current Outpatient Medications:    acetaminophen (TYLENOL) 500 MG tablet, Take 500 mg by mouth., Disp: , Rfl:    amLODipine (NORVASC) 2.5 MG tablet, Take by mouth., Disp: , Rfl:    amLODipine (NORVASC) 5 MG tablet, Take 2.5 mg by mouth daily. (Patient not taking: Reported on 11/10/2020), Disp: , Rfl:    ARIPiprazole (ABILIFY) 5 MG tablet, Take by mouth., Disp: , Rfl:    aspirin EC 81 MG tablet, Take 1 tablet (81 mg total) by mouth daily., Disp: 150 tablet, Rfl: 2   buPROPion (WELLBUTRIN XL) 300 MG 24 hr tablet, Take 300 mg by mouth.  (Patient not taking: No sig reported), Disp: , Rfl:    cephALEXin (KEFLEX) 500 MG capsule, Take 500 mg by mouth 3 (three) times daily. (Patient not taking: No sig reported), Disp: , Rfl:    COMBIVENT RESPIMAT 20-100 MCG/ACT AERS respimat, Inhale 1 puff into the lungs 4 (four) times daily. (Patient not taking: Reported on 11/10/2020), Disp: , Rfl:    diclofenac sodium (VOLTAREN) 1 % GEL, Apply topically. (Patient not taking: Reported on 11/10/2020), Disp: , Rfl:    DULoxetine (CYMBALTA) 60 MG capsule, Take 60 mg by mouth daily., Disp: , Rfl:    ELIQUIS 5 MG TABS tablet, 5 mg 2 (two) times daily. , Disp: , Rfl: 4   ipratropium (ATROVENT) 0.06 % nasal spray, Place into the nose., Disp: , Rfl:    levalbuterol (XOPENEX HFA) 45 MCG/ACT inhaler, Inhale into the lungs., Disp: , Rfl:    losartan-hydrochlorothiazide (HYZAAR) 100-25 MG tablet, Take 1 tablet by  mouth daily., Disp: , Rfl:    metoprolol succinate (TOPROL-XL) 100 MG 24 hr tablet, Take 100 mg by mouth daily., Disp: , Rfl:    Multiple Vitamin (MULTIVITAMIN) capsule, Take 1 capsule by mouth daily., Disp: , Rfl:    mupirocin ointment (BACTROBAN) 2 %, SMARTSIG:1 Application Topical 2-3 Times Daily, Disp: , Rfl:    primidone (MYSOLINE) 50 MG tablet, Take 50 mg by mouth 2 (two) times daily., Disp: , Rfl:    simvastatin (ZOCOR) 40 MG tablet, Take 40 mg by mouth daily at 6 PM. , Disp: , Rfl: 0  Past Medical History: Past Medical History:  Diagnosis Date   Arthritis    COPD (chronic obstructive pulmonary disease) ([place])    Hyperlipidemia    Hypertension    Squamous cell skin cancer     Tobacco Use: Social History   Tobacco Use  Smoking Status Former   Packs/day: 0.75   Years: 48.00   Pack years: 36.00   Types: Cigarettes   Quit date: 06/08/2020   Years since quitting: 0.5  Smokeless Tobacco Former    Labs: Recent Chemical engineer   There is no flowsheet data to display.      Pulmonary Assessment Scores:  Pulmonary Assessment Scores     Row Name 11/20/20 1513         ADL UCSD   SOB Score total 59     Rest  0     Walk 0     Stairs 5     Bath 2     Dress 2     Shop 3       CAT Score   CAT Score 16       mMRC Score   mMRC Score 2              UCSD: Self-administered rating of dyspnea associated with activities of daily living (ADLs) 6-point scale (0 = "not at all" to 5 = "maximal or unable to do because of breathlessness")  Scoring Scores range from 0 to 120.  Minimally important difference is 5 units  CAT: CAT can identify the health impairment of COPD patients and is better correlated with disease progression.  CAT has a scoring range of zero to 40. The CAT score is classified into four groups of low (less than 10), medium (10 - 20), high (21-30) and very high (31-40) based on the impact level of disease on health status. A CAT score over 10  suggests significant symptoms.  A worsening CAT score could be explained by an exacerbation, poor medication adherence, poor inhaler technique, or progression of COPD or comorbid conditions.  CAT MCID is 2 points  mMRC: mMRC (Modified Medical Research Council) Dyspnea Scale is used to assess the degree of baseline functional disability in patients of respiratory disease due to dyspnea. No minimal important difference is established. A decrease in score of 1 point or greater is considered a positive change.   Pulmonary Function Assessment:   Exercise Target Goals: Exercise Program Goal: Individual exercise prescription set using results from initial 6 min walk test and THRR while considering  patient's activity barriers and safety.   Exercise Prescription Goal: Initial exercise prescription builds to 30-45 minutes a day of aerobic activity, 2-3 days per week.  Home exercise guidelines will be given to patient during program as part of exercise prescription that the participant will acknowledge.  Education: Aerobic Exercise: - Group verbal and visual presentation on the components of exercise prescription. Introduces F.I.T.T principle from ACSM for exercise prescriptions.  Reviews F.I.T.T. principles of aerobic exercise including progression. Written material given at graduation. Flowsheet Row Pulmonary Rehab from 12/03/2020 in [facility] Cardiac and Pulmonary Rehab  Date 12/03/20  Educator AS  Instruction Review Code 1- Verbalizes Understanding       Education: Resistance Exercise: - Group verbal and visual presentation on the components of exercise prescription. Introduces F.I.T.T principle from ACSM for exercise prescriptions  Reviews F.I.T.T. principles of resistance exercise including progression. Written material given at graduation.    Education: Exercise & Equipment Safety: - Individual verbal instruction and demonstration of equipment use and safety with use of the  equipment. Flowsheet Row Pulmonary Rehab from 12/03/2020 in [facility] Cardiac and Pulmonary Rehab  Date 11/20/20  Educator AS  Instruction Review Code 1- Verbalizes Understanding       Education: Exercise Physiology & General Exercise Guidelines: - Group verbal and written instruction with models to review the exercise physiology of the cardiovascular system and associated critical values. Provides general exercise guidelines with specific guidelines to those with heart or lung disease.  Flowsheet Row Pulmonary Rehab from 12/03/2020 in [facility] Cardiac and Pulmonary Rehab  Date 11/26/20  Educator AS  Instruction Review Code 1- Verbalizes Understanding       Education: Flexibility, Balance, Mind/Body Relaxation: - Group verbal and visual presentation with interactive activity on the components of exercise prescription. Introduces F.I.T.T principle from ACSM  for exercise prescriptions. Reviews F.I.T.T. principles of flexibility and balance exercise training including progression. Also discusses the mind body connection.  Reviews various relaxation techniques to help reduce and manage stress (i.e. Deep breathing, progressive muscle relaxation, and visualization). Balance handout provided to take home. Written material given at graduation.   Activity Barriers & Risk Stratification:  Activity Barriers & Cardiac Risk Stratification - 11/10/20 1406       Activity Barriers & Cardiac Risk Stratification   Activity Barriers Balance Concerns;Arthritis    Comments walker             6 Minute Walk:  6 Minute Walk     Row Name 11/20/20 1505         6 Minute Walk   Phase Initial     Distance 640 feet     Walk Time 5.5 minutes     # of Rest Breaks 1     MPH 1.3     METS 1.4     RPE 15     Perceived Dyspnea  2     VO2 Peak 4.9     Symptoms Yes (comment)     Comments SOB     Resting HR 69 bpm     Resting BP 116/68     Resting Oxygen Saturation  95 %     Exercise Oxygen Saturation   during 6 min walk 87 %     Max Ex. HR 93 bpm     Max Ex. BP 152/70     2 Minute Post BP 110/74       Interval HR   1 Minute HR 81     2 Minute HR 68     3 Minute HR 93     4 Minute HR 89     5 Minute HR 89     6 Minute HR 68     2 Minute Post HR 69     Interval Heart Rate? Yes       Interval Oxygen   Interval Oxygen? Yes     Baseline Oxygen Saturation % 95 %     1 Minute Oxygen Saturation % 87 %     1 Minute Liters of Oxygen 3 L  pulsed     2 Minute Oxygen Saturation % 94 %     2 Minute Liters of Oxygen 3 L  pulsed     3 Minute Oxygen Saturation % 95 %     3 Minute Liters of Oxygen 3 L  pulsed     4 Minute Oxygen Saturation % 95 %     4 Minute Liters of Oxygen 3 L  pulsed     5 Minute Oxygen Saturation % 97 %     5 Minute Liters of Oxygen 3 L  pulsed     6 Minute Oxygen Saturation % 92 %     6 Minute Liters of Oxygen 3 L  pulsed     2 Minute Post Liters of Oxygen 3 L  pulsed             Oxygen Initial Assessment:  Oxygen Initial Assessment - 11/20/20 1518       Home Oxygen   Home Oxygen Device Portable Concentrator;Home Concentrator    Sleep Oxygen Prescription Continuous    Liters per minute 3    Home Exercise Oxygen Prescription Continuous    Liters per minute 3    Home Resting Oxygen Prescription Continuous    Liters per  minute 3    Compliance with Home Oxygen Use Yes      Initial 6 min Walk   Oxygen Used Pulsed    Liters per minute 3      Program Oxygen Prescription   Program Oxygen Prescription Continuous    Liters per minute 3      Intervention   Short Term Goals To learn and exhibit compliance with exercise, home and travel O2 prescription;To learn and understand importance of monitoring SPO2 with pulse oximeter and demonstrate accurate use of the pulse oximeter.;To learn and understand importance of maintaining oxygen saturations>88%;To learn and demonstrate proper pursed lip breathing techniques or other breathing techniques. ;To learn and  demonstrate proper use of respiratory medications             Oxygen Re-Evaluation:  Oxygen Re-Evaluation     Row Name 11/24/20 1410             Program Oxygen Prescription   Program Oxygen Prescription Continuous       Liters per minute 3         Home Oxygen   Home Oxygen Device Portable Concentrator;Home Concentrator       Sleep Oxygen Prescription Continuous       Liters per minute 3       Home Exercise Oxygen Prescription Continuous       Liters per minute 3       Home Resting Oxygen Prescription Continuous       Liters per minute 3       Compliance with Home Oxygen Use Yes         Goals/Expected Outcomes   Short Term Goals To learn and exhibit compliance with exercise, home and travel O2 prescription;To learn and understand importance of monitoring SPO2 with pulse oximeter and demonstrate accurate use of the pulse oximeter.;To learn and understand importance of maintaining oxygen saturations>88%;To learn and demonstrate proper pursed lip breathing techniques or other breathing techniques.        Long  Term Goals Exhibits compliance with exercise, home  and travel O2 prescription;Verbalizes importance of monitoring SPO2 with pulse oximeter and return demonstration;Maintenance of O2 saturations>88%;Exhibits proper breathing techniques, such as pursed lip breathing or other method taught during program session;Compliance with respiratory medication       Comments Reviewed PLB technique with pt.  Talked about how it works and it's importance in maintaining their exercise saturations.       Goals/Expected Outcomes Short: Become more profiecient at using PLB.   Long: Become independent at using PLB.                Oxygen Discharge (Final Oxygen Re-Evaluation):  Oxygen Re-Evaluation - 11/24/20 1410       Program Oxygen Prescription   Program Oxygen Prescription Continuous    Liters per minute 3      Home Oxygen   Home Oxygen Device Portable Concentrator;Home  Concentrator    Sleep Oxygen Prescription Continuous    Liters per minute 3    Home Exercise Oxygen Prescription Continuous    Liters per minute 3    Home Resting Oxygen Prescription Continuous    Liters per minute 3    Compliance with Home Oxygen Use Yes      Goals/Expected Outcomes   Short Term Goals To learn and exhibit compliance with exercise, home and travel O2 prescription;To learn and understand importance of monitoring SPO2 with pulse oximeter and demonstrate accurate use of the pulse oximeter.;To learn and understand  importance of maintaining oxygen saturations>88%;To learn and demonstrate proper pursed lip breathing techniques or other breathing techniques.     Long  Term Goals Exhibits compliance with exercise, home  and travel O2 prescription;Verbalizes importance of monitoring SPO2 with pulse oximeter and return demonstration;Maintenance of O2 saturations>88%;Exhibits proper breathing techniques, such as pursed lip breathing or other method taught during program session;Compliance with respiratory medication    Comments Reviewed PLB technique with pt.  Talked about how it works and it's importance in maintaining their exercise saturations.    Goals/Expected Outcomes Short: Become more profiecient at using PLB.   Long: Become independent at using PLB.             Initial Exercise Prescription:  Initial Exercise Prescription - 11/20/20 1500       Date of Initial Exercise RX and Referring Provider   Date 11/20/20    Referring Provider Aleskerov      Oxygen   Oxygen Continuous    Liters 3    Maintain Oxygen Saturation 88% or higher      Arm Ergometer   Level 1    RPM 25    Minutes 15    METs 1.4      REL-XR   Level 1    Speed 50    Minutes 15    METs 1.4      Biostep-RELP   Level 1    SPM 50    Minutes 15    METs 1.4      Track   Laps 7    Minutes 15    METs 1.4      Prescription Details   Frequency (times per week) 3    Duration Progress to 30  minutes of continuous aerobic without signs/symptoms of physical distress      Intensity   THRR 40-80% of Max Heartrate 99-130    Ratings of Perceived Exertion 11-15    Perceived Dyspnea 0-4      Progression   Progression Continue to progress workloads to maintain intensity without signs/symptoms of physical distress.      Resistance Training   Training Prescription Yes    Weight 3 lb    Reps 10-15             Perform Capillary Blood Glucose checks as needed.  Exercise Prescription Changes:   Exercise Prescription Changes     Row Name 11/20/20 1500 12/03/20 1300           Response to Exercise   Blood Pressure (Admit) 116/68 146/70      Blood Pressure (Exercise) 152/70 132/60      Blood Pressure (Exit) 110/74 108/64      Heart Rate (Admit) 69 bpm 69 bpm      Heart Rate (Exercise) 89 bpm 76 bpm      Heart Rate (Exit) 69 bpm 67 bpm      Oxygen Saturation (Admit) 95 % 99 %      Oxygen Saturation (Exercise) 87 % 89 %      Oxygen Saturation (Exit) 99 % 95 %      Rating of Perceived Exertion (Exercise) 15 13      Perceived Dyspnea (Exercise) 2 3      Symptoms SOB SOB      Comments -- 3rd exercise day      Duration -- Progress to 30 minutes of  aerobic without signs/symptoms of physical distress      Intensity -- THRR unchanged  Progression   Progression -- Continue to progress workloads to maintain intensity without signs/symptoms of physical distress.      Average METs -- 1.55        Resistance Training   Training Prescription -- Yes      Weight -- 3 lb      Reps -- 10-15        Oxygen   Oxygen -- Continuous      Liters -- 3        NuStep   Level -- 1      Minutes -- 15      METs -- 1.5               Exercise Comments:   Exercise Comments     Row Name 11/24/20 1409           Exercise Comments First full day of exercise!  Patient was oriented to gym and equipment including functions, settings, policies, and procedures.  Patient's  individual exercise prescription and treatment plan were reviewed.  All starting workloads were established based on the results of the 6 minute walk test done at initial orientation visit.  The plan for exercise progression was also introduced and progression will be customized based on patient's performance and goals.                Exercise Goals and Review:   Exercise Goals     Row Name 11/20/20 1512             Exercise Goals   Increase Physical Activity Yes       Intervention Provide advice, education, support and counseling about physical activity/exercise needs.;Develop an individualized exercise prescription for aerobic and resistive training based on initial evaluation findings, risk stratification, comorbidities and participant's personal goals.       Expected Outcomes Short Term: Attend rehab on a regular basis to increase amount of physical activity.;Long Term: Add in home exercise to make exercise part of routine and to increase amount of physical activity.;Long Term: Exercising regularly at least 3-5 days a week.       Increase Strength and Stamina Yes       Intervention Provide advice, education, support and counseling about physical activity/exercise needs.;Develop an individualized exercise prescription for aerobic and resistive training based on initial evaluation findings, risk stratification, comorbidities and participant's personal goals.       Expected Outcomes Short Term: Increase workloads from initial exercise prescription for resistance, speed, and METs.;Short Term: Perform resistance training exercises routinely during rehab and add in resistance training at home;Long Term: Improve cardiorespiratory fitness, muscular endurance and strength as measured by increased METs and functional capacity (6MWT)       Able to understand and use rate of perceived exertion (RPE) scale Yes       Intervention Provide education and explanation on how to use RPE scale        Expected Outcomes Short Term: Able to use RPE daily in rehab to express subjective intensity level;Long Term:  Able to use RPE to guide intensity level when exercising independently       Able to understand and use Dyspnea scale Yes       Intervention Provide education and explanation on how to use Dyspnea scale       Expected Outcomes Short Term: Able to use Dyspnea scale daily in rehab to express subjective sense of shortness of breath during exertion;Long Term: Able to use Dyspnea scale to guide intensity level when  exercising independently       Knowledge and understanding of Target Heart Rate Range (THRR) Yes       Intervention Provide education and explanation of THRR including how the numbers were predicted and where they are located for reference       Expected Outcomes Short Term: Able to state/look up THRR;Short Term: Able to use daily as guideline for intensity in rehab;Long Term: Able to use THRR to govern intensity when exercising independently       Able to check pulse independently Yes       Intervention Provide education and demonstration on how to check pulse in carotid and radial arteries.;Review the importance of being able to check your own pulse for safety during independent exercise       Expected Outcomes Short Term: Able to explain why pulse checking is important during independent exercise;Long Term: Able to check pulse independently and accurately       Understanding of Exercise Prescription Yes       Intervention Provide education, explanation, and written materials on patient's individual exercise prescription       Expected Outcomes Short Term: Able to explain program exercise prescription;Long Term: Able to explain home exercise prescription to exercise independently                Exercise Goals Re-Evaluation :  Exercise Goals Re-Evaluation     [place] Name 11/24/20 1409 12/03/20 1304           Exercise Goal Re-Evaluation   Exercise Goals Review Increase  Physical Activity;Able to understand and use rate of perceived exertion (RPE) scale;Knowledge and understanding of Target Heart Rate Range (THRR);Understanding of Exercise Prescription;Increase Strength and Stamina;Able to understand and use Dyspnea scale;Able to check pulse independently Increase Physical Activity;Increase Strength and Stamina      Comments Reviewed RPE and dyspnea scales, THR and program prescription with pt today.  Pt voiced understanding and was given a copy of goals to take home. Erin Trujillo has tolerated exxercise well so far.  She has tried arm crank, track and NuStep.  Staff will monitor progress.      Expected Outcomes Short: Use RPE daily to regulate intensity. Long: Follow program prescription in THR. Short:  attend consistently Long: build stamina               Discharge Exercise Prescription (Final Exercise Prescription Changes):  Exercise Prescription Changes - 12/03/20 1300       Response to Exercise   Blood Pressure (Admit) 146/70    Blood Pressure (Exercise) 132/60    Blood Pressure (Exit) 108/64    Heart Rate (Admit) 69 bpm    Heart Rate (Exercise) 76 bpm    Heart Rate (Exit) 67 bpm    Oxygen Saturation (Admit) 99 %    Oxygen Saturation (Exercise) 89 %    Oxygen Saturation (Exit) 95 %    Rating of Perceived Exertion (Exercise) 13    Perceived Dyspnea (Exercise) 3    Symptoms SOB    Comments 3rd exercise day    Duration Progress to 30 minutes of  aerobic without signs/symptoms of physical distress    Intensity THRR unchanged      Progression   Progression Continue to progress workloads to maintain intensity without signs/symptoms of physical distress.    Average METs 1.55      Resistance Training   Training Prescription Yes    Weight 3 lb    Reps 10-15      Oxygen  Oxygen Continuous    Liters 3      NuStep   Level 1    Minutes 15    METs 1.5             Nutrition:  Target Goals: Understanding of nutrition guidelines, daily intake of  sodium 1500mg , cholesterol 200mg , calories 30% from fat and 7% or less from saturated fats, daily to have 5 or more servings of fruits and vegetables.  Education: All About Nutrition: -Group instruction provided by verbal, written material, interactive activities, discussions, models, and posters to present general guidelines for heart healthy nutrition including fat, fiber, MyPlate, the role of sodium in heart healthy nutrition, utilization of the nutrition label, and utilization of this knowledge for meal planning. Follow up email sent as well. Written material given at graduation.   Biometrics:    Nutrition Therapy Plan and Nutrition Goals:  Nutrition Therapy & Goals - 11/10/20 1432       Personal Nutrition Goals   Comments She gets lunch at her retirement facility, but lately hasn't been going due to the heat. She doesn't feel comfortable cooking big meals for herself because her walker and oxygen tubing get tangled up in the kitchen. She mainly has been eating tuna fish salad.             Nutrition Assessments:  MEDIFICTS Score Key: ?70 Need to make dietary changes  40-70 Heart Healthy Diet ? 40 Therapeutic Level Cholesterol Diet  Flowsheet Row Pulmonary Rehab from 11/20/2020 in [facility] Cardiac and Pulmonary Rehab  Picture Your Plate Total Score on Admission 66      Picture Your Plate Scores: <56 Unhealthy dietary pattern with much room for improvement. 41-50 Dietary pattern unlikely to meet recommendations for good health and room for improvement. 51-60 More healthful dietary pattern, with some room for improvement.  >60 Healthy dietary pattern, although there may be some specific behaviors that could be improved.   Nutrition Goals Re-Evaluation:   Nutrition Goals Discharge (Final Nutrition Goals Re-Evaluation):   Psychosocial: Target Goals: Acknowledge presence or absence of significant depression and/or stress, maximize coping skills, provide positive support  system. Participant is able to verbalize types and ability to use techniques and skills needed for reducing stress and depression.   Education: Stress, Anxiety, and Depression - Group verbal and visual presentation to define topics covered.  Reviews how body is impacted by stress, anxiety, and depression.  Also discusses healthy ways to reduce stress and to treat/manage anxiety and depression.  Written material given at graduation.   Education: Sleep Hygiene -Provides group verbal and written instruction about how sleep can affect your health.  Define sleep hygiene, discuss sleep cycles and impact of sleep habits. Review good sleep hygiene tips.    Initial Review & Psychosocial Screening:  Initial Psych Review & Screening - 11/10/20 1411       Initial Review   Current issues with Current Sleep Concerns;History of Depression;Current Depression      Family Dynamics   Good Support System? Yes   2 sons and their wives, grandson, next door neighbor, 2 out of town friends     Barriers   Psychosocial barriers to participate in program The patient should benefit from training in stress management and relaxation.;There are no identifiable barriers or psychosocial needs.      Screening Interventions   Interventions To provide support and resources with identified psychosocial needs;Encouraged to exercise;Provide feedback about the scores to participant    Expected Outcomes Short Term goal:  Utilizing psychosocial counselor, staff and physician to assist with identification of specific Stressors or current issues interfering with healing process. Setting desired goal for each stressor or current issue identified.;Long Term Goal: Stressors or current issues are controlled or eliminated.;Short Term goal: Identification and review with participant of any Quality of Life or Depression concerns found by scoring the questionnaire.;Long Term goal: The participant improves quality of Life and PHQ9 Scores as  seen by post scores and/or verbalization of changes             Quality of Life Scores:  Scores of 19 and below usually indicate a poorer quality of life in these areas.  A difference of  2-3 points is a clinically meaningful difference.  A difference of 2-3 points in the total score of the Quality of Life Index has been associated with significant improvement in overall quality of life, self-image, physical symptoms, and general health in studies assessing change in quality of life.  PHQ-9: Recent Review Flowsheet Data     Depression screen [facility] 2/9 11/20/2020   Decreased Interest 1   Down, Depressed, Hopeless 1   PHQ - 2 Score 2   Altered sleeping 2   Tired, decreased energy 2   Change in appetite 1   Feeling bad or failure about yourself  0   Trouble concentrating 2   Moving slowly or fidgety/restless 0   Suicidal thoughts 0   PHQ-9 Score 9   Difficult doing work/chores Not difficult at all      Interpretation of Total Score  Total Score Depression Severity:  1-4 = Minimal depression, 5-9 = Mild depression, 10-14 = Moderate depression, 15-19 = Moderately severe depression, 20-27 = Severe depression   Psychosocial Evaluation and Intervention:  Psychosocial Evaluation - 11/10/20 1428       Psychosocial Evaluation & Interventions   Interventions Encouraged to exercise with the program and follow exercise prescription;Stress management education;Relaxation education    Comments Erin Trujillo is coming to pulmonary rehab due to worsening breathing issues. She does wear oxygen and sometimes struggles breathing through her nose which leads to dropped oxygen saturation that leads to dizziness. She is trying to work on her breathing techniques. She lives in a retirement community that she enjoys. Her sons are close by and one grandson comes weekly to help her with trash,groceries, etc. She is very thankful for her support system. Her balance issues and breathing difficulties make it harder  on her to cook like she used to and enjoy other activities, but she is learning to pace herself. She does have a history of depression and states that currently she is feeling well and just works harder to find her happiness on the bad days    Expected Outcomes Short: attend pulmonary rehab for education and exercise. Long: develop and maintain positive self care habits.    Continue Psychosocial Services  Follow up required by staff             Psychosocial Re-Evaluation:   Psychosocial Discharge (Final Psychosocial Re-Evaluation):   Education: Education Goals: Education classes will be provided on a weekly basis, covering required topics. Participant will state understanding/return demonstration of topics presented.  Learning Barriers/Preferences:  Learning Barriers/Preferences - 11/10/20 1407       Learning Barriers/Preferences   Learning Barriers None    Learning Preferences None             General Pulmonary Education Topics:  Infection Prevention: - Provides verbal and written material to individual with  discussion of infection control including proper hand washing and proper equipment cleaning during exercise session. Flowsheet Row Pulmonary Rehab from 12/03/2020 in [facility]  Date 11/20/20  Educator AS  Instruction Review Code 1- Verbalizes Understanding       Falls Prevention: - Provides verbal and written material to individual with discussion of falls prevention and safety. Flowsheet Row Pulmonary Rehab from 12/03/2020 in [facility] Card[facility] Pulmonary Rehab  Date 11/20/20  Educator AS  Instruction Review Code 1- Verbalizes Understanding       Chronic Lung Disease Review: - Group verbal instruction with posters, models, PowerPoint presentations and videos,  to review new updates, new respiratory medications, new advancements in procedures and treatments. Providing information on websites and "800" numbers for continued  self-education. Includes information about supplement oxygen, available portable oxygen systems, continuous and intermittent flow rates, oxygen safety, concentrators, and Medicare reimbursement for oxygen. Explanation of Pulmonary Drugs, including class, frequency, complications, importance of spacers, rinsing mouth after steroid MDI's, and proper cleaning methods for nebulizers. Review of basic lung anatomy and physiology related to function, structure, and complications of lung disease. Review of risk factors. Discussion about methods for diagnosing sleep apnea and types of masks and machines for OSA. Includes a review of the use of types of environmental controls: home humidity, furnaces, filters, dust mite/pet prevention, HEPA vacuums. Discussion about weather changes, air quality and the benefits of nasal washing. Instruction on Warning signs, infection symptoms, calling MD promptly, preventive modes, and value of vaccinations. Review of effective airway clearance, coughing and/or vibration techniques. Emphasizing that all should Create an Action Plan. Written material given at graduation. Flowsheet Row Pulmonary Rehab from 12/03/2020 in [facility] Cardiac and Pulmonary Rehab  Education need identified 11/20/20       AED/CPR: - Group verbal and written instruction with the use of models to demonstrate the basic use of the AED with the basic ABC's of resuscitation.    Anatomy and Cardiac Procedures: - Group verbal and visual presentation and models provide information about basic cardiac anatomy and function. Reviews the testing methods done to diagnose heart disease and the outcomes of the test results. Describes the treatment choices: Medical Management, Angioplasty, or Coronary Bypass Surgery for treating various heart conditions including Myocardial Infarction, Angina, Valve Disease, and Cardiac Arrhythmias.  Written material given at graduation.   Medication Safety: - Group verbal and visual  instruction to review commonly prescribed medications for heart and lung disease. Reviews the medication, class of the drug, and side effects. Includes the steps to properly store meds and maintain the prescription regimen.  Written material given at graduation.   Other: -Provides group and verbal instruction on various topics (see comments)   Knowledge Questionnaire Score:  Knowledge Questionnaire Score - 11/20/20 1515       Knowledge Questionnaire Score   Pre Score 15/18              Core Components/Risk Factors/Patient Goals at Admission:  Personal Goals and Risk Factors at Admission - 11/20/20 1517       Core Components/Risk Factors/Patient Goals on Admission   Improve shortness of breath with ADL's Yes    Intervention Provide education, individualized exercise plan and daily activity instruction to help decrease symptoms of SOB with activities of daily living.    Expected Outcomes Short Term: Improve cardiorespiratory fitness to achieve a reduction of symptoms when performing ADLs;Long Term: Be able to perform more ADLs without symptoms or delay the onset of symptoms  Hypertension Yes    Intervention Provide education on lifestyle modifcations including regular physical activity/exercise, weight management, moderate sodium restriction and increased consumption of fresh fruit, vegetables, and low fat dairy, alcohol moderation, and smoking cessation.;Monitor prescription use compliance.    Expected Outcomes Long Term: Maintenance of blood pressure at goal levels.;Short Term: Continued assessment and intervention until BP is < 140/75mm HG in hypertensive participants. < 130/55mm HG in hypertensive participants with diabetes, heart failure or chronic kidney disease.    Lipids Yes    Intervention Provide education and support for participant on nutrition & aerobic/resistive exercise along with prescribed medications to achieve LDL 70mg , HDL >40mg .    Expected Outcomes Short Term:  Participant states understanding of desired cholesterol values and is compliant with medications prescribed. Participant is following exercise prescription and nutrition guidelines.;Long Term: Cholesterol controlled with medications as prescribed, with individualized exercise RX and with personalized nutrition plan. Value goals: LDL < 70mg , HDL > 40 mg.             Education:Diabetes - Individual verbal and written instruction to review signs/symptoms of diabetes, desired ranges of glucose level fasting, after meals and with exercise. Acknowledge that pre and post exercise glucose checks will be done for 3 sessions at entry of program.   Know Your Numbers and Heart Failure: - Group verbal and visual instruction to discuss disease risk factors for cardiac and pulmonary disease and treatment options.  Reviews associated critical values for Overweight/Obesity, Hypertension, Cholesterol, and Diabetes.  Discusses basics of heart failure: signs/symptoms and treatments.  Introduces Heart Failure Zone chart for action plan for heart failure.  Written material given at graduation.   Core Components/Risk Factors/Patient Goals Review:    Core Components/Risk Factors/Patient Goals at Discharge (Final Review):    ITP Comments:  ITP Comments     Row Name 11/10/20 1416 11/20/20 1522 11/24/20 1407 12/10/20 0823     ITP Comments Initial telephone orientation completed. Diagnosis can be found in [facility] 8/31. EP orientation scheduled for Thursday 10/13 at 2pm. Completed 6MWT and gym orientation. Initial ITP created and sent for review to Dr. Ottie Glazier, Medical Director. First full day of exercise!  Patient was oriented to gym and equipment including functions, settings, policies, and procedures.  Patient's individual exercise prescription and treatment plan were reviewed.  All starting workloads were established based on the results of the 6 minute walk test done at initial orientation visit.  The plan for  exercise progression was also introduced and progression will be customized based on patient's performance and goals. 30 Day review completed. Medical Director ITP review done, changes made as directed, and signed approval by Medical Director.   New to prgram             Comments:  30 Day review completed. Medical Director ITP review done, changes made as directed, and signed approval by Medical Director.

## 2020-12-15 ENCOUNTER — Telehealth: Payer: Self-pay

## 2020-12-15 NOTE — Telephone Encounter (Signed)
Erin Trujillo reports still feeling sick and coughing up yellow phlegm. She reports getting medication from her doctor that has not been called in yet - she is not sure what they are prescribing. She reports contacting her doctor and is not sure if they want to see her in person or if they will just prescribe her medication. She is not sure what she is sick with, but feels it may be bronchitis. She reports she will not be here for rehab today and will probably not return this week. Asked her to keep [place] updated.

## 2020-12-22 ENCOUNTER — Encounter: Payer: Medicare Other | Attending: Pulmonary Disease

## 2020-12-22 DIAGNOSIS — J449 Chronic obstructive pulmonary disease, unspecified: Secondary | ICD-10-CM | POA: Insufficient documentation

## 2020-12-25 ENCOUNTER — Telehealth: Payer: Self-pay

## 2020-12-25 DIAGNOSIS — J449 Chronic obstructive pulmonary disease, unspecified: Secondary | ICD-10-CM

## 2020-12-25 NOTE — Telephone Encounter (Signed)
Left message for patient regarding Pulmonary Rehab attendance. Patient has not attended since 12/04/20. Asked for call back.

## 2020-12-25 NOTE — Telephone Encounter (Signed)
Patient called back, doesn't feel as if she can complete the program right now as she has too much going on (not getting enough sleep, stressed, etc.). Patient would like her spot released and will call Dr. Teodoro Kil office to let them know what's going on and see if she can get referred back when she feels ready. Patient will call us back next week.

## 2020-12-30 ENCOUNTER — Encounter: Payer: Self-pay | Admitting: *Deleted

## 2020-12-30 DIAGNOSIS — J449 Chronic obstructive pulmonary disease, unspecified: Secondary | ICD-10-CM

## 2020-12-30 NOTE — Progress Notes (Signed)
Pulmonary Individual Treatment Plan  Patient Details  Name: Erin Trujillo MRN: [id number] Date of Birth: [birth year] Referring Provider:   April Manson Pulmonary Rehab from 11/20/2020 in [facility] Cardiac and Pulmonary Rehab  Referring Provider Aleskerov       Initial Encounter Date:  Flowsheet Row Pulmonary Rehab from 11/20/2020 in [facility] Cardiac and Pulmonary Rehab  Date 11/20/20       Visit Diagnosis: Chronic obstructive pulmonary disease, unspecified COPD type ([place])  Patient's Home Medications on Admission:  Current Outpatient Medications:    acetaminophen (TYLENOL) 500 MG tablet, Take 500 mg by mouth., Disp: , Rfl:    amLODipine (NORVASC) 2.5 MG tablet, Take by mouth., Disp: , Rfl:    amLODipine (NORVASC) 5 MG tablet, Take 2.5 mg by mouth daily. (Patient not taking: Reported on 11/10/2020), Disp: , Rfl:    ARIPiprazole (ABILIFY) 5 MG tablet, Take by mouth., Disp: , Rfl:    aspirin EC 81 MG tablet, Take 1 tablet (81 mg total) by mouth daily., Disp: 150 tablet, Rfl: 2   buPROPion (WELLBUTRIN XL) 300 MG 24 hr tablet, Take 300 mg by mouth.  (Patient not taking: No sig reported), Disp: , Rfl:    cephALEXin (KEFLEX) 500 MG capsule, Take 500 mg by mouth 3 (three) times daily. (Patient not taking: No sig reported), Disp: , Rfl:    COMBIVENT RESPIMAT 20-100 MCG/ACT AERS respimat, Inhale 1 puff into the lungs 4 (four) times daily. (Patient not taking: Reported on 11/10/2020), Disp: , Rfl:    diclofenac sodium (VOLTAREN) 1 % GEL, Apply topically. (Patient not taking: Reported on 11/10/2020), Disp: , Rfl:    DULoxetine (CYMBALTA) 60 MG capsule, Take 60 mg by mouth daily., Disp: , Rfl:    ELIQUIS 5 MG TABS tablet, 5 mg 2 (two) times daily. , Disp: , Rfl: 4   ipratropium (ATROVENT) 0.06 % nasal spray, Place into the nose., Disp: , Rfl:    levalbuterol (XOPENEX HFA) 45 MCG/ACT inhaler, Inhale into the lungs., Disp: , Rfl:    losartan-hydrochlorothiazide (HYZAAR) 100-25 MG tablet, Take 1 tablet by  mouth daily., Disp: , Rfl:    metoprolol succinate (TOPROL-XL) 100 MG 24 hr tablet, Take 100 mg by mouth daily., Disp: , Rfl:    Multiple Vitamin (MULTIVITAMIN) capsule, Take 1 capsule by mouth daily., Disp: , Rfl:    mupirocin ointment (BACTROBAN) 2 %, SMARTSIG:1 Application Topical 2-3 Times Daily, Disp: , Rfl:    primidone (MYSOLINE) 50 MG tablet, Take 50 mg by mouth 2 (two) times daily., Disp: , Rfl:    simvastatin (ZOCOR) 40 MG tablet, Take 40 mg by mouth daily at 6 PM. , Disp: , Rfl: 0  Past Medical History: Past Medical History:  Diagnosis Date   Arthritis    COPD (chronic obstructive pulmonary disease) ([place])    Hyperlipidemia    Hypertension    Squamous cell skin cancer     Tobacco Use: Social History   Tobacco Use  Smoking Status Former   Packs/day: 0.75   Years: 48.00   Pack years: 36.00   Types: Cigarettes   Quit date: 06/08/2020   Years since quitting: 0.5  Smokeless Tobacco Former    Labs: Recent Chemical engineer   There is no flowsheet data to display.      Pulmonary Assessment Scores:  Pulmonary Assessment Scores     Row Name 11/20/20 1513         ADL UCSD   SOB Score total 59     Rest  0     Walk 0     Stairs 5     Bath 2     Dress 2     Shop 3       CAT Score   CAT Score 16       mMRC Score   mMRC Score 2              UCSD: Self-administered rating of dyspnea associated with activities of daily living (ADLs) 6-point scale (0 = "not at all" to 5 = "maximal or unable to do because of breathlessness")  Scoring Scores range from 0 to 120.  Minimally important difference is 5 units  CAT: CAT can identify the health impairment of COPD patients and is better correlated with disease progression.  CAT has a scoring range of zero to 40. The CAT score is classified into four groups of low (less than 10), medium (10 - 20), high (21-30) and very high (31-40) based on the impact level of disease on health status. A CAT score over 10  suggests significant symptoms.  A worsening CAT score could be explained by an exacerbation, poor medication adherence, poor inhaler technique, or progression of COPD or comorbid conditions.  CAT MCID is 2 points  mMRC: mMRC (Modified Medical Research Council) Dyspnea Scale is used to assess the degree of baseline functional disability in patients of respiratory disease due to dyspnea. No minimal important difference is established. A decrease in score of 1 point or greater is considered a positive change.   Pulmonary Function Assessment:   Exercise Target Goals: Exercise Program Goal: Individual exercise prescription set using results from initial 6 min walk test and THRR while considering  patient's activity barriers and safety.   Exercise Prescription Goal: Initial exercise prescription builds to 30-45 minutes a day of aerobic activity, 2-3 days per week.  Home exercise guidelines will be given to patient during program as part of exercise prescription that the participant will acknowledge.  Education: Aerobic Exercise: - Group verbal and visual presentation on the components of exercise prescription. Introduces F.I.T.T principle from ACSM for exercise prescriptions.  Reviews F.I.T.T. principles of aerobic exercise including progression. Written material given at graduation. Flowsheet Row Pulmonary Rehab from 12/03/2020 in [facility] Cardiac and Pulmonary Rehab  Date 12/03/20  Educator AS  Instruction Review Code 1- Verbalizes Understanding       Education: Resistance Exercise: - Group verbal and visual presentation on the components of exercise prescription. Introduces F.I.T.T principle from ACSM for exercise prescriptions  Reviews F.I.T.T. principles of resistance exercise including progression. Written material given at graduation.    Education: Exercise & Equipment Safety: - Individual verbal instruction and demonstration of equipment use and safety with use of the  equipment. Flowsheet Row Pulmonary Rehab from 12/03/2020 in [facility] Cardiac and Pulmonary Rehab  Date 11/20/20  Educator AS  Instruction Review Code 1- Verbalizes Understanding       Education: Exercise Physiology & General Exercise Guidelines: - Group verbal and written instruction with models to review the exercise physiology of the cardiovascular system and associated critical values. Provides general exercise guidelines with specific guidelines to those with heart or lung disease.  Flowsheet Row Pulmonary Rehab from 12/03/2020 in [facility] Cardiac and Pulmonary Rehab  Date 11/26/20  Educator AS  Instruction Review Code 1- Verbalizes Understanding       Education: Flexibility, Balance, Mind/Body Relaxation: - Group verbal and visual presentation with interactive activity on the components of exercise prescription. Introduces F.I.T.T principle from ACSM  for exercise prescriptions. Reviews F.I.T.T. principles of flexibility and balance exercise training including progression. Also discusses the mind body connection.  Reviews various relaxation techniques to help reduce and manage stress (i.e. Deep breathing, progressive muscle relaxation, and visualization). Balance handout provided to take home. Written material given at graduation.   Activity Barriers & Risk Stratification:  Activity Barriers & Cardiac Risk Stratification - 11/10/20 1406       Activity Barriers & Cardiac Risk Stratification   Activity Barriers Balance Concerns;Arthritis    Comments walker             6 Minute Walk:  6 Minute Walk     Row Name 11/20/20 1505         6 Minute Walk   Phase Initial     Distance 640 feet     Walk Time 5.5 minutes     # of Rest Breaks 1     MPH 1.3     METS 1.4     RPE 15     Perceived Dyspnea  2     VO2 Peak 4.9     Symptoms Yes (comment)     Comments SOB     Resting HR 69 bpm     Resting BP 116/68     Resting Oxygen Saturation  95 %     Exercise Oxygen Saturation   during 6 min walk 87 %     Max Ex. HR 93 bpm     Max Ex. BP 152/70     2 Minute Post BP 110/74       Interval HR   1 Minute HR 81     2 Minute HR 68     3 Minute HR 93     4 Minute HR 89     5 Minute HR 89     6 Minute HR 68     2 Minute Post HR 69     Interval Heart Rate? Yes       Interval Oxygen   Interval Oxygen? Yes     Baseline Oxygen Saturation % 95 %     1 Minute Oxygen Saturation % 87 %     1 Minute Liters of Oxygen 3 L  pulsed     2 Minute Oxygen Saturation % 94 %     2 Minute Liters of Oxygen 3 L  pulsed     3 Minute Oxygen Saturation % 95 %     3 Minute Liters of Oxygen 3 L  pulsed     4 Minute Oxygen Saturation % 95 %     4 Minute Liters of Oxygen 3 L  pulsed     5 Minute Oxygen Saturation % 97 %     5 Minute Liters of Oxygen 3 L  pulsed     6 Minute Oxygen Saturation % 92 %     6 Minute Liters of Oxygen 3 L  pulsed     2 Minute Post Liters of Oxygen 3 L  pulsed             Oxygen Initial Assessment:  Oxygen Initial Assessment - 11/20/20 1518       Home Oxygen   Home Oxygen Device Portable Concentrator;Home Concentrator    Sleep Oxygen Prescription Continuous    Liters per minute 3    Home Exercise Oxygen Prescription Continuous    Liters per minute 3    Home Resting Oxygen Prescription Continuous    Liters per  minute 3    Compliance with Home Oxygen Use Yes      Initial 6 min Walk   Oxygen Used Pulsed    Liters per minute 3      Program Oxygen Prescription   Program Oxygen Prescription Continuous    Liters per minute 3      Intervention   Short Term Goals To learn and exhibit compliance with exercise, home and travel O2 prescription;To learn and understand importance of monitoring SPO2 with pulse oximeter and demonstrate accurate use of the pulse oximeter.;To learn and understand importance of maintaining oxygen saturations>88%;To learn and demonstrate proper pursed lip breathing techniques or other breathing techniques. ;To learn and  demonstrate proper use of respiratory medications             Oxygen Re-Evaluation:  Oxygen Re-Evaluation     Row Name 11/24/20 1410             Program Oxygen Prescription   Program Oxygen Prescription Continuous       Liters per minute 3         Home Oxygen   Home Oxygen Device Portable Concentrator;Home Concentrator       Sleep Oxygen Prescription Continuous       Liters per minute 3       Home Exercise Oxygen Prescription Continuous       Liters per minute 3       Home Resting Oxygen Prescription Continuous       Liters per minute 3       Compliance with Home Oxygen Use Yes         Goals/Expected Outcomes   Short Term Goals To learn and exhibit compliance with exercise, home and travel O2 prescription;To learn and understand importance of monitoring SPO2 with pulse oximeter and demonstrate accurate use of the pulse oximeter.;To learn and understand importance of maintaining oxygen saturations>88%;To learn and demonstrate proper pursed lip breathing techniques or other breathing techniques.        Long  Term Goals Exhibits compliance with exercise, home  and travel O2 prescription;Verbalizes importance of monitoring SPO2 with pulse oximeter and return demonstration;Maintenance of O2 saturations>88%;Exhibits proper breathing techniques, such as pursed lip breathing or other method taught during program session;Compliance with respiratory medication       Comments Reviewed PLB technique with pt.  Talked about how it works and it's importance in maintaining their exercise saturations.       Goals/Expected Outcomes Short: Become more profiecient at using PLB.   Long: Become independent at using PLB.                Oxygen Discharge (Final Oxygen Re-Evaluation):  Oxygen Re-Evaluation - 11/24/20 1410       Program Oxygen Prescription   Program Oxygen Prescription Continuous    Liters per minute 3      Home Oxygen   Home Oxygen Device Portable Concentrator;Home  Concentrator    Sleep Oxygen Prescription Continuous    Liters per minute 3    Home Exercise Oxygen Prescription Continuous    Liters per minute 3    Home Resting Oxygen Prescription Continuous    Liters per minute 3    Compliance with Home Oxygen Use Yes      Goals/Expected Outcomes   Short Term Goals To learn and exhibit compliance with exercise, home and travel O2 prescription;To learn and understand importance of monitoring SPO2 with pulse oximeter and demonstrate accurate use of the pulse oximeter.;To learn and understand  importance of maintaining oxygen saturations>88%;To learn and demonstrate proper pursed lip breathing techniques or other breathing techniques.     Long  Term Goals Exhibits compliance with exercise, home  and travel O2 prescription;Verbalizes importance of monitoring SPO2 with pulse oximeter and return demonstration;Maintenance of O2 saturations>88%;Exhibits proper breathing techniques, such as pursed lip breathing or other method taught during program session;Compliance with respiratory medication    Comments Reviewed PLB technique with pt.  Talked about how it works and it's importance in maintaining their exercise saturations.    Goals/Expected Outcomes Short: Become more profiecient at using PLB.   Long: Become independent at using PLB.             Initial Exercise Prescription:  Initial Exercise Prescription - 11/20/20 1500       Date of Initial Exercise RX and Referring Provider   Date 11/20/20    Referring Provider Aleskerov      Oxygen   Oxygen Continuous    Liters 3    Maintain Oxygen Saturation 88% or higher      Arm Ergometer   Level 1    RPM 25    Minutes 15    METs 1.4      REL-XR   Level 1    Speed 50    Minutes 15    METs 1.4      Biostep-RELP   Level 1    SPM 50    Minutes 15    METs 1.4      Track   Laps 7    Minutes 15    METs 1.4      Prescription Details   Frequency (times per week) 3    Duration Progress to 30  minutes of continuous aerobic without signs/symptoms of physical distress      Intensity   THRR 40-80% of Max Heartrate 99-130    Ratings of Perceived Exertion 11-15    Perceived Dyspnea 0-4      Progression   Progression Continue to progress workloads to maintain intensity without signs/symptoms of physical distress.      Resistance Training   Training Prescription Yes    Weight 3 lb    Reps 10-15             Perform Capillary Blood Glucose checks as needed.  Exercise Prescription Changes:   Exercise Prescription Changes     Row Name 11/20/20 1500 12/03/20 1300           Response to Exercise   Blood Pressure (Admit) 116/68 146/70      Blood Pressure (Exercise) 152/70 132/60      Blood Pressure (Exit) 110/74 108/64      Heart Rate (Admit) 69 bpm 69 bpm      Heart Rate (Exercise) 89 bpm 76 bpm      Heart Rate (Exit) 69 bpm 67 bpm      Oxygen Saturation (Admit) 95 % 99 %      Oxygen Saturation (Exercise) 87 % 89 %      Oxygen Saturation (Exit) 99 % 95 %      Rating of Perceived Exertion (Exercise) 15 13      Perceived Dyspnea (Exercise) 2 3      Symptoms SOB SOB      Comments -- 3rd exercise day      Duration -- Progress to 30 minutes of  aerobic without signs/symptoms of physical distress      Intensity -- THRR unchanged  Progression   Progression -- Continue to progress workloads to maintain intensity without signs/symptoms of physical distress.      Average METs -- 1.55        Resistance Training   Training Prescription -- Yes      Weight -- 3 lb      Reps -- 10-15        Oxygen   Oxygen -- Continuous      Liters -- 3        NuStep   Level -- 1      Minutes -- 15      METs -- 1.5               Exercise Comments:   Exercise Comments     Row Name 11/24/20 1409           Exercise Comments First full day of exercise!  Patient was oriented to gym and equipment including functions, settings, policies, and procedures.  Patient's  individual exercise prescription and treatment plan were reviewed.  All starting workloads were established based on the results of the 6 minute walk test done at initial orientation visit.  The plan for exercise progression was also introduced and progression will be customized based on patient's performance and goals.                Exercise Goals and Review:   Exercise Goals     Row Name 11/20/20 1512             Exercise Goals   Increase Physical Activity Yes       Intervention Provide advice, education, support and counseling about physical activity/exercise needs.;Develop an individualized exercise prescription for aerobic and resistive training based on initial evaluation findings, risk stratification, comorbidities and participant's personal goals.       Expected Outcomes Short Term: Attend rehab on a regular basis to increase amount of physical activity.;Long Term: Add in home exercise to make exercise part of routine and to increase amount of physical activity.;Long Term: Exercising regularly at least 3-5 days a week.       Increase Strength and Stamina Yes       Intervention Provide advice, education, support and counseling about physical activity/exercise needs.;Develop an individualized exercise prescription for aerobic and resistive training based on initial evaluation findings, risk stratification, comorbidities and participant's personal goals.       Expected Outcomes Short Term: Increase workloads from initial exercise prescription for resistance, speed, and METs.;Short Term: Perform resistance training exercises routinely during rehab and add in resistance training at home;Long Term: Improve cardiorespiratory fitness, muscular endurance and strength as measured by increased METs and functional capacity (6MWT)       Able to understand and use rate of perceived exertion (RPE) scale Yes       Intervention Provide education and explanation on how to use RPE scale        Expected Outcomes Short Term: Able to use RPE daily in rehab to express subjective intensity level;Long Term:  Able to use RPE to guide intensity level when exercising independently       Able to understand and use Dyspnea scale Yes       Intervention Provide education and explanation on how to use Dyspnea scale       Expected Outcomes Short Term: Able to use Dyspnea scale daily in rehab to express subjective sense of shortness of breath during exertion;Long Term: Able to use Dyspnea scale to guide intensity level when  exercising independently       Knowledge and understanding of Target Heart Rate Range (THRR) Yes       Intervention Provide education and explanation of THRR including how the numbers were predicted and where they are located for reference       Expected Outcomes Short Term: Able to state/look up THRR;Short Term: Able to use daily as guideline for intensity in rehab;Long Term: Able to use THRR to govern intensity when exercising independently       Able to check pulse independently Yes       Intervention Provide education and demonstration on how to check pulse in carotid and radial arteries.;Review the importance of being able to check your own pulse for safety during independent exercise       Expected Outcomes Short Term: Able to explain why pulse checking is important during independent exercise;Long Term: Able to check pulse independently and accurately       Understanding of Exercise Prescription Yes       Intervention Provide education, explanation, and written materials on patient's individual exercise prescription       Expected Outcomes Short Term: Able to explain program exercise prescription;Long Term: Able to explain home exercise prescription to exercise independently                Exercise Goals Re-Evaluation :  Exercise Goals Re-Evaluation     [place] Name 11/24/20 1409 12/03/20 1304 12/15/20 0904         Exercise Goal Re-Evaluation   Exercise Goals Review  Increase Physical Activity;Able to understand and use rate of perceived exertion (RPE) scale;Knowledge and understanding of Target Heart Rate Range (THRR);Understanding of Exercise Prescription;Increase Strength and Stamina;Able to understand and use Dyspnea scale;Able to check pulse independently Increase Physical Activity;Increase Strength and Stamina Increase Physical Activity;Increase Strength and Stamina     Comments Reviewed RPE and dyspnea scales, THR and program prescription with pt today.  Pt voiced understanding and was given a copy of goals to take home. Bethena Roys has tolerated exxercise well so far.  She has tried arm crank, track and NuStep.  Staff will monitor progress. Bethena Roys has not attended rehab the last week as she has not been feeling well. She is due back to exercise today, 11/7. Staff will encourage consistent attendance from patient to yield better results from the program.     Expected Outcomes Short: Use RPE daily to regulate intensity. Long: Follow program prescription in THR. Short:  attend consistently Long: build stamina Short: Maintain consistent attendance Long:Contine to increase overall strength and stamina              Discharge Exercise Prescription (Final Exercise Prescription Changes):  Exercise Prescription Changes - 12/03/20 1300       Response to Exercise   Blood Pressure (Admit) 146/70    Blood Pressure (Exercise) 132/60    Blood Pressure (Exit) 108/64    Heart Rate (Admit) 69 bpm    Heart Rate (Exercise) 76 bpm    Heart Rate (Exit) 67 bpm    Oxygen Saturation (Admit) 99 %    Oxygen Saturation (Exercise) 89 %    Oxygen Saturation (Exit) 95 %    Rating of Perceived Exertion (Exercise) 13    Perceived Dyspnea (Exercise) 3    Symptoms SOB    Comments 3rd exercise day    Duration Progress to 30 minutes of  aerobic without signs/symptoms of physical distress    Intensity THRR unchanged      Progression  Progression Continue to progress workloads to  maintain intensity without signs/symptoms of physical distress.    Average METs 1.55      Resistance Training   Training Prescription Yes    Weight 3 lb    Reps 10-15      Oxygen   Oxygen Continuous    Liters 3      NuStep   Level 1    Minutes 15    METs 1.5             Nutrition:  Target Goals: Understanding of nutrition guidelines, daily intake of sodium 1500mg , cholesterol 200mg , calories 30% from fat and 7% or less from saturated fats, daily to have 5 or more servings of fruits and vegetables.  Education: All About Nutrition: -Group instruction provided by verbal, written material, interactive activities, discussions, models, and posters to present general guidelines for heart healthy nutrition including fat, fiber, MyPlate, the role of sodium in heart healthy nutrition, utilization of the nutrition label, and utilization of this knowledge for meal planning. Follow up email sent as well. Written material given at graduation.   Biometrics:    Nutrition Therapy Plan and Nutrition Goals:  Nutrition Therapy & Goals - 11/10/20 1432       Personal Nutrition Goals   Comments She gets lunch at her retirement facility, but lately hasn't been going due to the heat. She doesn't feel comfortable cooking big meals for herself because her walker and oxygen tubing get tangled up in the kitchen. She mainly has been eating tuna fish salad.             Nutrition Assessments:  MEDIFICTS Score Key: ?70 Need to make dietary changes  40-70 Heart Healthy Diet ? 40 Therapeutic Level Cholesterol Diet  Flowsheet Row Pulmonary Rehab from 11/20/2020 in [facility] Cardiac and Pulmonary Rehab  Picture Your Plate Total Score on Admission 66      Picture Your Plate Scores: <01 Unhealthy dietary pattern with much room for improvement. 41-50 Dietary pattern unlikely to meet recommendations for good health and room for improvement. 51-60 More healthful dietary pattern, with some room for  improvement.  >60 Healthy dietary pattern, although there may be some specific behaviors that could be improved.   Nutrition Goals Re-Evaluation:   Nutrition Goals Discharge (Final Nutrition Goals Re-Evaluation):   Psychosocial: Target Goals: Acknowledge presence or absence of significant depression and/or stress, maximize coping skills, provide positive support system. Participant is able to verbalize types and ability to use techniques and skills needed for reducing stress and depression.   Education: Stress, Anxiety, and Depression - Group verbal and visual presentation to define topics covered.  Reviews how body is impacted by stress, anxiety, and depression.  Also discusses healthy ways to reduce stress and to treat/manage anxiety and depression.  Written material given at graduation.   Education: Sleep Hygiene -Provides group verbal and written instruction about how sleep can affect your health.  Define sleep hygiene, discuss sleep cycles and impact of sleep habits. Review good sleep hygiene tips.    Initial Review & Psychosocial Screening:  Initial Psych Review & Screening - 11/10/20 1411       Initial Review   Current issues with Current Sleep Concerns;History of Depression;Current Depression      Family Dynamics   Good Support System? Yes   2 sons and their wives, grandson, next door neighbor, 2 out of town friends     Barriers   Psychosocial barriers to participate in program The patient should  benefit from training in stress management and relaxation.;There are no identifiable barriers or psychosocial needs.      Screening Interventions   Interventions To provide support and resources with identified psychosocial needs;Encouraged to exercise;Provide feedback about the scores to participant    Expected Outcomes Short Term goal: Utilizing psychosocial counselor, staff and physician to assist with identification of specific Stressors or current issues interfering with  healing process. Setting desired goal for each stressor or current issue identified.;Long Term Goal: Stressors or current issues are controlled or eliminated.;Short Term goal: Identification and review with participant of any Quality of Life or Depression concerns found by scoring the questionnaire.;Long Term goal: The participant improves quality of Life and PHQ9 Scores as seen by post scores and/or verbalization of changes             Quality of Life Scores:  Scores of 19 and below usually indicate a poorer quality of life in these areas.  A difference of  2-3 points is a clinically meaningful difference.  A difference of 2-3 points in the total score of the Quality of Life Index has been associated with significant improvement in overall quality of life, self-image, physical symptoms, and general health in studies assessing change in quality of life.  PHQ-9: Recent Review Flowsheet Data     Depression screen [facility] [place] 2/9 11/20/2020   Decreased Interest 1   Down, Depressed, Hopeless 1   PHQ - 2 Score 2   Altered sleeping 2   Tired, decreased energy 2   Change in appetite 1   Feeling bad or failure about yourself  0   Trouble concentrating 2   Moving slowly or fidgety/restless 0   Suicidal thoughts 0   PHQ-9 Score 9   Difficult doing work/chores Not difficult at all      Interpretation of Total Score  Total Score Depression Severity:  1-4 = Minimal depression, 5-9 = Mild depression, 10-14 = Moderate depression, 15-19 = Moderately severe depression, 20-27 = Severe depression   Psychosocial Evaluation and Intervention:  Psychosocial Evaluation - 11/10/20 1428       Psychosocial Evaluation & Interventions   Interventions Encouraged to exercise with the program and follow exercise prescription;Stress management education;Relaxation education    Comments Bethena Roys is coming to pulmonary rehab due to worsening breathing issues. She does wear oxygen and sometimes struggles breathing through  her nose which leads to dropped oxygen saturation that leads to dizziness. She is trying to work on her breathing techniques. She lives in a retirement community that she enjoys. Her sons are close by and one grandson comes weekly to help her with trash,groceries, etc. She is very thankful for her support system. Her balance issues and breathing difficulties make it harder on her to cook like she used to and enjoy other activities, but she is learning to pace herself. She does have a history of depression and states that currently she is feeling well and just works harder to find her happiness on the bad days    Expected Outcomes Short: attend pulmonary rehab for education and exercise. Long: develop and maintain positive self care habits.    Continue Psychosocial Services  Follow up required by staff             Psychosocial Re-Evaluation:   Psychosocial Discharge (Final Psychosocial Re-Evaluation):   Education: Education Goals: Education classes will be provided on a weekly basis, covering required topics. Participant will state understanding/return demonstration of topics presented.  Learning Barriers/Preferences:  Learning Barriers/Preferences -  11/10/20 1407       Learning Barriers/Preferences   Learning Barriers None    Learning Preferences None             General Pulmonary Education Topics:  Infection Prevention: - Provides verbal and written material to individual with discussion of infection control including proper hand washing and proper equipment cleaning during exercise session. Flowsheet Row Pulmonary Rehab from 12/03/2020 in [facility] Cardiac and Pulmonary Rehab  Date 11/20/20  Educator AS  Instruction Review Code 1- Verbalizes Understanding       Falls Prevention: - Provides verbal and written material to individual with discussion of falls prevention and safety. Flowsheet Row Pulmonary Rehab from 12/03/2020 in [facility] Cardiac and Pulmonary Rehab  Date  11/20/20  Educator AS  Instruction Review Code 1- Verbalizes Understanding       Chronic Lung Disease Review: - Group verbal instruction with posters, models, PowerPoint presentations and videos,  to review new updates, new respiratory medications, new advancements in procedures and treatments. Providing information on websites and "800" numbers for continued self-education. Includes information about supplement oxygen, available portable oxygen systems, continuous and intermittent flow rates, oxygen safety, concentrators, and Medicare reimbursement for oxygen. Explanation of Pulmonary Drugs, including class, frequency, complications, importance of spacers, rinsing mouth after steroid MDI's, and proper cleaning methods for nebulizers. Review of basic lung anatomy and physiology related to function, structure, and complications of lung disease. Review of risk factors. Discussion about methods for diagnosing sleep apnea and types of masks and machines for OSA. Includes a review of the use of types of environmental controls: home humidity, furnaces, filters, dust mite/pet prevention, HEPA vacuums. Discussion about weather changes, air quality and the benefits of nasal washing. Instruction on Warning signs, infection symptoms, calling MD promptly, preventive modes, and value of vaccinations. Review of effective airway clearance, coughing and/or vibration techniques. Emphasizing that all should Create an Action Plan. Written material given at graduation. Flowsheet Row Pulmonary Rehab from 12/03/2020 in [facility] Cardiac and Pulmonary Rehab  Education need identified 11/20/20       AED/CPR: - Group verbal and written instruction with the use of models to demonstrate the basic use of the AED with the basic ABC's of resuscitation.    Anatomy and Cardiac Procedures: - Group verbal and visual presentation and models provide information about basic cardiac anatomy and function. Reviews the testing methods done  to diagnose heart disease and the outcomes of the test results. Describes the treatment choices: Medical Management, Angioplasty, or Coronary Bypass Surgery for treating various heart conditions including Myocardial Infarction, Angina, Valve Disease, and Cardiac Arrhythmias.  Written material given at graduation.   Medication Safety: - Group verbal and visual instruction to review commonly prescribed medications for heart and lung disease. Reviews the medication, class of the drug, and side effects. Includes the steps to properly store meds and maintain the prescription regimen.  Written material given at graduation.   Other: -Provides group and verbal instruction on various topics (see comments)   Knowledge Questionnaire Score:  Knowledge Questionnaire Score - 11/20/20 1515       Knowledge Questionnaire Score   Pre Score 15/18              Core Components/Risk Factors/Patient Goals at Admission:  Personal Goals and Risk Factors at Admission - 11/20/20 1517       Core Components/Risk Factors/Patient Goals on Admission   Improve shortness of breath with ADL's Yes    Intervention Provide education, individualized exercise plan and daily  activity instruction to help decrease symptoms of SOB with activities of daily living.    Expected Outcomes Short Term: Improve cardiorespiratory fitness to achieve a reduction of symptoms when performing ADLs;Long Term: Be able to perform more ADLs without symptoms or delay the onset of symptoms    Hypertension Yes    Intervention Provide education on lifestyle modifcations including regular physical activity/exercise, weight management, moderate sodium restriction and increased consumption of fresh fruit, vegetables, and low fat dairy, alcohol moderation, and smoking cessation.;Monitor prescription use compliance.    Expected Outcomes Long Term: Maintenance of blood pressure at goal levels.;Short Term: Continued assessment and intervention until BP  is < 140/52mm HG in hypertensive participants. < 130/4mm HG in hypertensive participants with diabetes, heart failure or chronic kidney disease.    Lipids Yes    Intervention Provide education and support for participant on nutrition & aerobic/resistive exercise along with prescribed medications to achieve LDL 70mg , HDL >40mg .    Expected Outcomes Short Term: Participant states understanding of desired cholesterol values and is compliant with medications prescribed. Participant is following exercise prescription and nutrition guidelines.;Long Term: Cholesterol controlled with medications as prescribed, with individualized exercise RX and with personalized nutrition plan. Value goals: LDL < 70mg , HDL > 40 mg.             Education:Diabetes - Individual verbal and written instruction to review signs/symptoms of diabetes, desired ranges of glucose level fasting, after meals and with exercise. Acknowledge that pre and post exercise glucose checks will be done for 3 sessions at entry of program.   Know Your Numbers and Heart Failure: - Group verbal and visual instruction to discuss disease risk factors for cardiac and pulmonary disease and treatment options.  Reviews associated critical values for Overweight/Obesity, Hypertension, Cholesterol, and Diabetes.  Discusses basics of heart failure: signs/symptoms and treatments.  Introduces Heart Failure Zone chart for action plan for heart failure.  Written material given at graduation.   Core Components/Risk Factors/Patient Goals Review:    Core Components/Risk Factors/Patient Goals at Discharge (Final Review):    ITP Comments:  ITP Comments     Row Name 11/10/20 1416 11/20/20 1522 11/24/20 1407 12/10/20 0823 12/15/20 0908   ITP Comments Initial telephone orientation completed. Diagnosis can be found in [facility] 8/31. EP orientation scheduled for Thursday 10/13 at 2pm. Completed 6MWT and gym orientation. Initial ITP created and sent for review to  Dr. Ottie Glazier, Medical Director. First full day of exercise!  Patient was oriented to gym and equipment including functions, settings, policies, and procedures.  Patient's individual exercise prescription and treatment plan were reviewed.  All starting workloads were established based on the results of the 6 minute walk test done at initial orientation visit.  The plan for exercise progression was also introduced and progression will be customized based on patient's performance and goals. 30 Day review completed. Medical Director ITP review done, changes made as directed, and signed approval by Medical Director.   New to prgram Patient was out all last week due to not feeling well. Patient has not attended since 10/27. She is due to come back today.    [place] Name 12/25/20 1504 12/30/20 1153         ITP Comments Left message for patient regarding Pulmonary Rehab attendance. Patient has called out multiple times and has not attended since 10/27. Asked for call back. Discharging per patient request. Catha Gosselin had just moved before starting the program and she has not been able to get all in  order like she needs.  She will return with a new referral after the first of the year.               Comments: Discharge ITP

## 2021-06-22 ENCOUNTER — Other Ambulatory Visit (INDEPENDENT_AMBULATORY_CARE_PROVIDER_SITE_OTHER): Payer: Self-pay | Admitting: Nurse Practitioner

## 2021-06-22 DIAGNOSIS — I6523 Occlusion and stenosis of bilateral carotid arteries: Secondary | ICD-10-CM

## 2021-06-22 DIAGNOSIS — I739 Peripheral vascular disease, unspecified: Secondary | ICD-10-CM

## 2021-06-23 ENCOUNTER — Ambulatory Visit (INDEPENDENT_AMBULATORY_CARE_PROVIDER_SITE_OTHER): Payer: Medicare Other

## 2021-06-23 ENCOUNTER — Ambulatory Visit (INDEPENDENT_AMBULATORY_CARE_PROVIDER_SITE_OTHER): Payer: Medicare Other | Admitting: Vascular Surgery

## 2021-06-23 DIAGNOSIS — I739 Peripheral vascular disease, unspecified: Secondary | ICD-10-CM | POA: Diagnosis not present

## 2021-06-23 DIAGNOSIS — I6523 Occlusion and stenosis of bilateral carotid arteries: Secondary | ICD-10-CM | POA: Diagnosis not present

## 2021-06-26 ENCOUNTER — Encounter (INDEPENDENT_AMBULATORY_CARE_PROVIDER_SITE_OTHER): Payer: Self-pay | Admitting: *Deleted

## 2021-08-10 ENCOUNTER — Other Ambulatory Visit: Payer: Self-pay | Admitting: Pulmonary Disease

## 2021-08-10 DIAGNOSIS — J449 Chronic obstructive pulmonary disease, unspecified: Secondary | ICD-10-CM

## 2021-10-20 ENCOUNTER — Other Ambulatory Visit: Payer: Self-pay

## 2021-10-20 DIAGNOSIS — Z122 Encounter for screening for malignant neoplasm of respiratory organs: Secondary | ICD-10-CM

## 2021-10-20 DIAGNOSIS — Z87891 Personal history of nicotine dependence: Secondary | ICD-10-CM

## 2021-11-04 ENCOUNTER — Ambulatory Visit: Admission: RE | Admit: 2021-11-04 | Payer: Medicare Other | Source: Ambulatory Visit

## 2021-11-11 ENCOUNTER — Ambulatory Visit: Admission: RE | Admit: 2021-11-11 | Payer: Medicare Other | Source: Ambulatory Visit

## 2021-11-18 ENCOUNTER — Ambulatory Visit
Admission: RE | Admit: 2021-11-18 | Discharge: 2021-11-18 | Disposition: A | Discharge status: Home / Self Care | Payer: Medicare Other | Source: Ambulatory Visit | Attending: Acute Care | Admitting: Acute Care

## 2021-11-18 DIAGNOSIS — Z122 Encounter for screening for malignant neoplasm of respiratory organs: Secondary | ICD-10-CM | POA: Diagnosis present

## 2021-11-18 DIAGNOSIS — Z87891 Personal history of nicotine dependence: Secondary | ICD-10-CM | POA: Insufficient documentation

## 2021-11-20 ENCOUNTER — Other Ambulatory Visit: Payer: Self-pay

## 2021-11-20 DIAGNOSIS — Z87891 Personal history of nicotine dependence: Secondary | ICD-10-CM

## 2021-11-20 DIAGNOSIS — Z122 Encounter for screening for malignant neoplasm of respiratory organs: Secondary | ICD-10-CM

## 2021-12-07 ENCOUNTER — Encounter (INDEPENDENT_AMBULATORY_CARE_PROVIDER_SITE_OTHER): Payer: Self-pay

## 2022-04-27 ENCOUNTER — Ambulatory Visit
Admission: RE | Admit: 2022-04-27 | Discharge: 2022-04-27 | Disposition: A | Discharge status: Home / Self Care | Payer: Medicare Other | Source: Ambulatory Visit | Attending: Physician Assistant | Admitting: Physician Assistant

## 2022-04-27 ENCOUNTER — Other Ambulatory Visit: Payer: Self-pay | Admitting: Physician Assistant

## 2022-04-27 DIAGNOSIS — R42 Dizziness and giddiness: Secondary | ICD-10-CM

## 2022-04-27 DIAGNOSIS — R413 Other amnesia: Secondary | ICD-10-CM | POA: Insufficient documentation

## 2022-05-11 ENCOUNTER — Other Ambulatory Visit: Payer: Self-pay | Admitting: Internal Medicine

## 2022-05-11 DIAGNOSIS — R413 Other amnesia: Secondary | ICD-10-CM

## 2022-05-13 ENCOUNTER — Ambulatory Visit
Admission: RE | Admit: 2022-05-13 | Discharge: 2022-05-13 | Disposition: A | Discharge status: Home / Self Care | Payer: Medicare Other | Source: Ambulatory Visit | Attending: Internal Medicine | Admitting: Internal Medicine

## 2022-05-13 DIAGNOSIS — R413 Other amnesia: Secondary | ICD-10-CM | POA: Insufficient documentation

## 2022-06-18 ENCOUNTER — Other Ambulatory Visit (INDEPENDENT_AMBULATORY_CARE_PROVIDER_SITE_OTHER): Payer: Self-pay | Admitting: Nurse Practitioner

## 2022-06-18 DIAGNOSIS — I739 Peripheral vascular disease, unspecified: Secondary | ICD-10-CM

## 2022-06-18 DIAGNOSIS — I6523 Occlusion and stenosis of bilateral carotid arteries: Secondary | ICD-10-CM

## 2022-06-22 ENCOUNTER — Encounter (INDEPENDENT_AMBULATORY_CARE_PROVIDER_SITE_OTHER): Payer: Medicare Other

## 2022-06-22 ENCOUNTER — Ambulatory Visit (INDEPENDENT_AMBULATORY_CARE_PROVIDER_SITE_OTHER): Payer: Medicare Other | Admitting: Nurse Practitioner

## 2022-07-14 ENCOUNTER — Encounter (INDEPENDENT_AMBULATORY_CARE_PROVIDER_SITE_OTHER): Payer: Medicare Other

## 2022-07-14 ENCOUNTER — Ambulatory Visit (INDEPENDENT_AMBULATORY_CARE_PROVIDER_SITE_OTHER): Payer: Medicare Other | Admitting: Nurse Practitioner

## 2022-07-14 ENCOUNTER — Encounter (INDEPENDENT_AMBULATORY_CARE_PROVIDER_SITE_OTHER): Payer: Self-pay

## 2022-09-04 ENCOUNTER — Other Ambulatory Visit: Payer: Self-pay | Admitting: Acute Care

## 2022-09-04 DIAGNOSIS — Z122 Encounter for screening for malignant neoplasm of respiratory organs: Secondary | ICD-10-CM

## 2022-09-04 DIAGNOSIS — Z87891 Personal history of nicotine dependence: Secondary | ICD-10-CM

## 2022-11-15 ENCOUNTER — Other Ambulatory Visit: Payer: Self-pay | Admitting: Student

## 2022-11-15 DIAGNOSIS — R296 Repeated falls: Secondary | ICD-10-CM

## 2022-11-15 DIAGNOSIS — R531 Weakness: Secondary | ICD-10-CM

## 2022-11-22 ENCOUNTER — Ambulatory Visit: Payer: Medicare Other

## 2023-02-19 ENCOUNTER — Emergency Department
Admission: EM | Admit: 2023-02-19 | Discharge: 2023-02-19 | Discharge status: Against Medical Advice | Payer: Medicare Other | Attending: Emergency Medicine | Admitting: Emergency Medicine

## 2023-02-19 ENCOUNTER — Other Ambulatory Visit: Payer: Self-pay

## 2023-02-19 DIAGNOSIS — R42 Dizziness and giddiness: Secondary | ICD-10-CM | POA: Diagnosis present

## 2023-02-19 DIAGNOSIS — R030 Elevated blood-pressure reading, without diagnosis of hypertension: Secondary | ICD-10-CM | POA: Insufficient documentation

## 2023-02-19 DIAGNOSIS — Z8673 Personal history of transient ischemic attack (TIA), and cerebral infarction without residual deficits: Secondary | ICD-10-CM | POA: Insufficient documentation

## 2023-02-19 DIAGNOSIS — Z5321 Procedure and treatment not carried out due to patient leaving prior to being seen by health care provider: Secondary | ICD-10-CM | POA: Diagnosis not present

## 2023-02-19 LAB — CBC
HCT: 42.5 % (ref 36.0–46.0)
Hemoglobin: 13.5 g/dL (ref 12.0–15.0)
MCH: 32.1 pg (ref 26.0–34.0)
MCHC: 31.8 g/dL (ref 30.0–36.0)
MCV: 101 fL — ABNORMAL HIGH (ref 80.0–100.0)
Platelets: 268 10*3/uL (ref 150–400)
RBC: 4.21 MIL/uL (ref 3.87–5.11)
RDW: 15.9 % — ABNORMAL HIGH (ref 11.5–15.5)
WBC: 7.9 10*3/uL (ref 4.0–10.5)
nRBC: 0 % (ref 0.0–0.2)

## 2023-02-19 LAB — BASIC METABOLIC PANEL
Anion gap: 11 (ref 5–15)
BUN: 19 mg/dL (ref 8–23)
CO2: 29 mmol/L (ref 22–32)
Calcium: 8.9 mg/dL (ref 8.9–10.3)
Chloride: 95 mmol/L — ABNORMAL LOW (ref 98–111)
Creatinine, Ser: 0.9 mg/dL (ref 0.44–1.00)
GFR, Estimated: >60 mL/min (ref >60)
Glucose, Bld: 105 mg/dL — ABNORMAL HIGH (ref 70–99)
Potassium: 3.3 mmol/L — ABNORMAL LOW (ref 3.5–5.1)
Sodium: 135 mmol/L (ref 135–145)

## 2023-02-19 NOTE — ED Provider Triage Note (Signed)
 Emergency Medicine Provider Triage Evaluation Note  Erin Trujillo , a 78 y.o. female  was evaluated in triage. History of CVA and a-fib. Pt complains of dizziness. Family state that her BP was high today. He also reports that her level of confusion has been worse for the past 3 weeks. Patient denies pain, blurred vision, or other concerns..   Physical Exam  There were no vitals taken for this visit. Gen:   Awake, no distress   Resp:  Normal effort  MSK:   Moves extremities without difficulty  Other:    Medical Decision Making  Medically screening exam initiated at 2:19 PM.  Appropriate orders placed.  Erin Trujillo was informed that the remainder of the evaluation will be completed by another provider, this initial triage assessment does not replace that evaluation, and the importance of remaining in the ED until their evaluation is complete.  Cardiac protocol started.   Erin Kirk NOVAK, FNP 02/19/23 1421

## 2023-02-19 NOTE — ED Triage Notes (Signed)
 Pt comes in from [facility] will compliant's of dizziness. Pt states that she has had chronic dizziness. According to family, the patient has a history of falls in the past, but no falls reported witht this visit.Pt is alert and oriented x2.  Pt has no complaints of pain at this time.

## 2023-03-11 ENCOUNTER — Other Ambulatory Visit (INDEPENDENT_AMBULATORY_CARE_PROVIDER_SITE_OTHER): Payer: Self-pay | Admitting: Adult Health

## 2023-03-11 DIAGNOSIS — R600 Localized edema: Secondary | ICD-10-CM

## 2023-03-11 DIAGNOSIS — R0989 Other specified symptoms and signs involving the circulatory and respiratory systems: Secondary | ICD-10-CM

## 2023-03-12 IMAGING — CT CT CHEST LUNG CANCER SCREENING LOW DOSE W/O CM
2 of 5 series · 15 of 40 positions shown, 18 images · non-contrast
Comparison: Chest CT 03/13/2015.

CLINICAL DATA: 75-year-old female former smoker (quit in June 2019)
with 45 pack-year history of smoking. Lung cancer screening
examination.

EXAM:
CT CHEST WITHOUT CONTRAST LOW-DOSE FOR LUNG CANCER SCREENING
TECHNIQUE: Multidetector CT imaging of the chest was performed following the
standard protocol without IV contrast.

[Series 3: lung 1.00 · axial · 0.68mm/px · z∈[-1328,-1010]mm · 12 of 350 slices shown, 15 images]
[im 16/350  mediastinal]
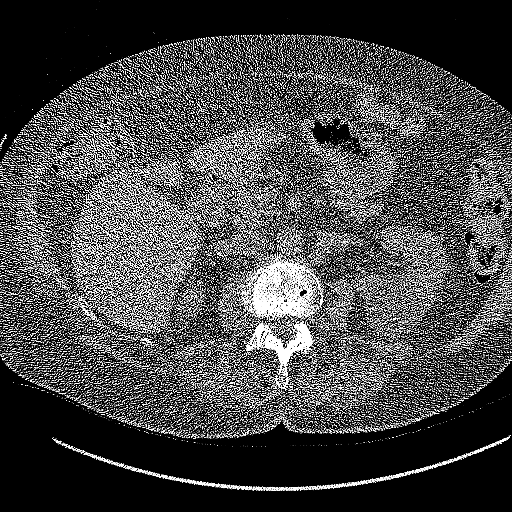
[im 16/350  lung]
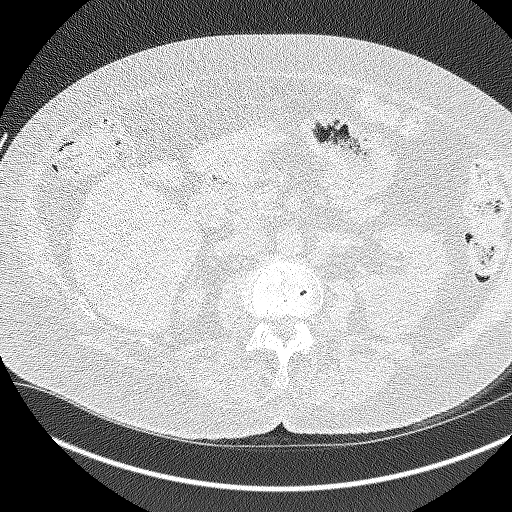
[im 48/350  lung]
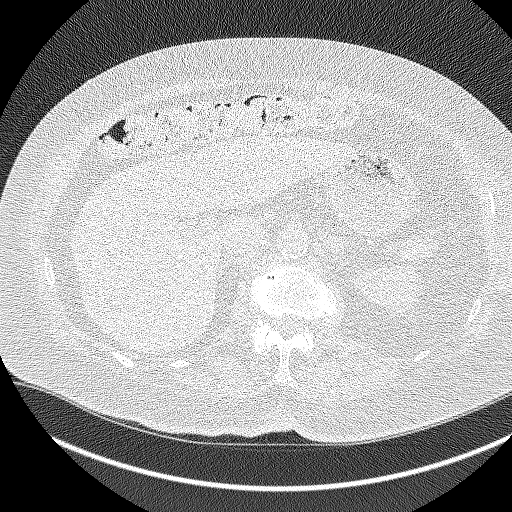
[im 80/350  lung]
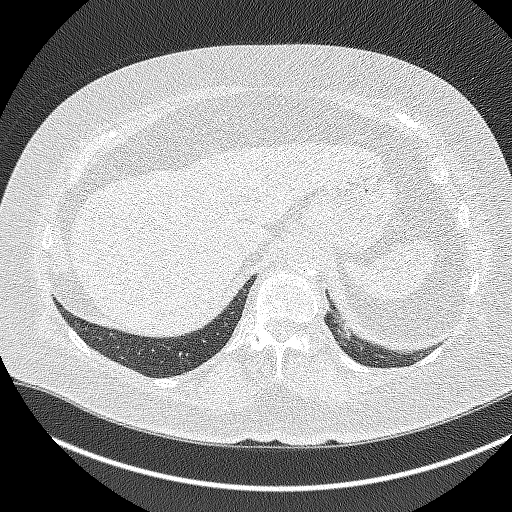
[im 112/350  lung]
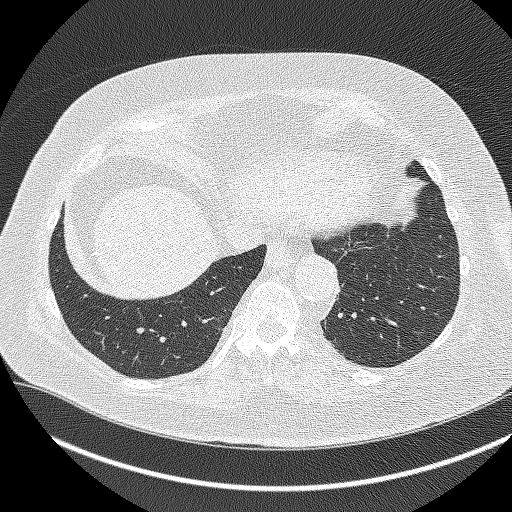
[im 127/350  mediastinal]
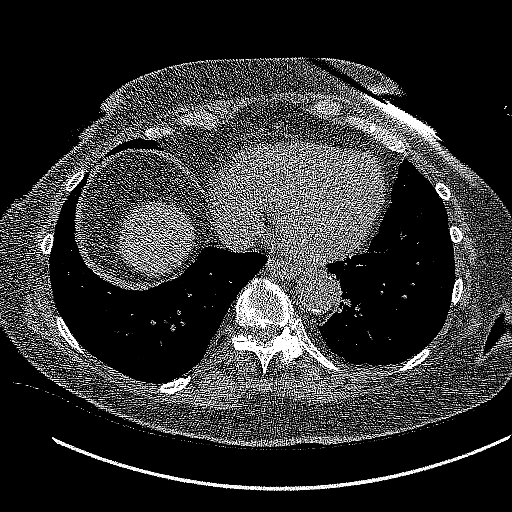
[im 127/350  lung]
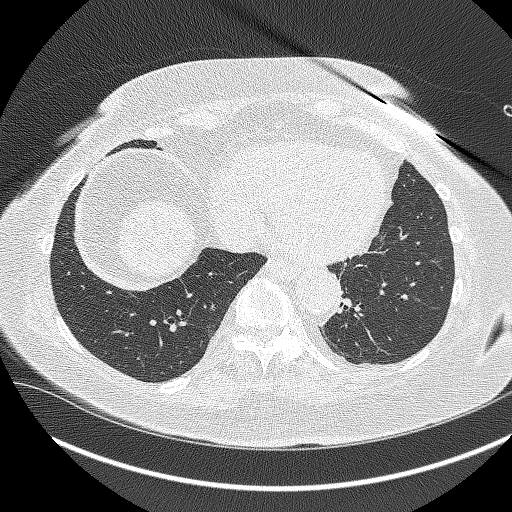
[im 159/350  lung]
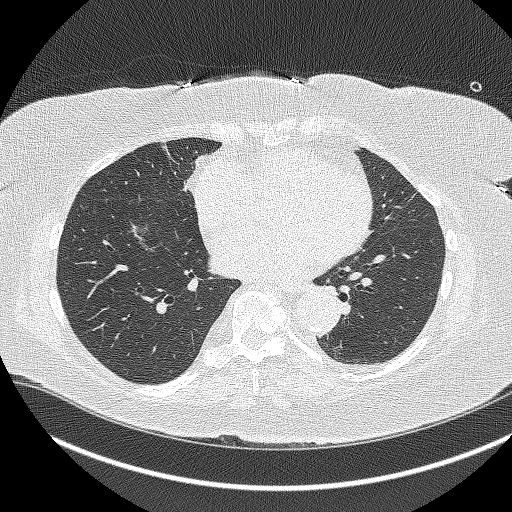
[im 191/350  lung]
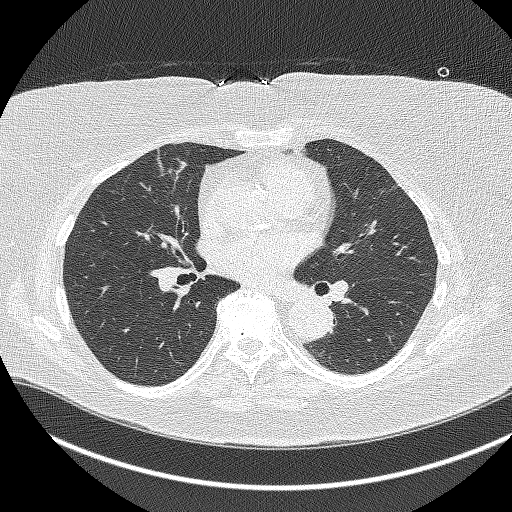
[im 223/350  lung]
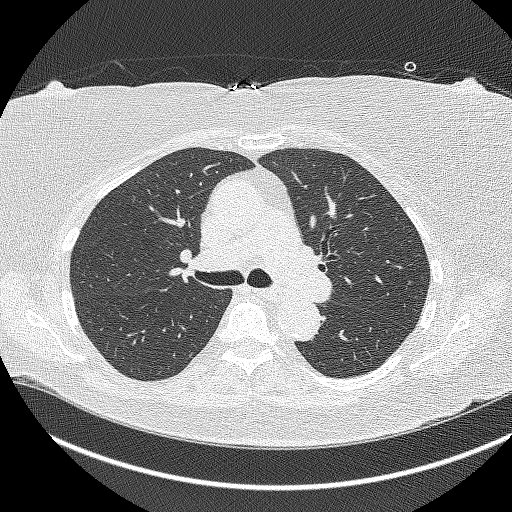
[im 238/350  mediastinal]
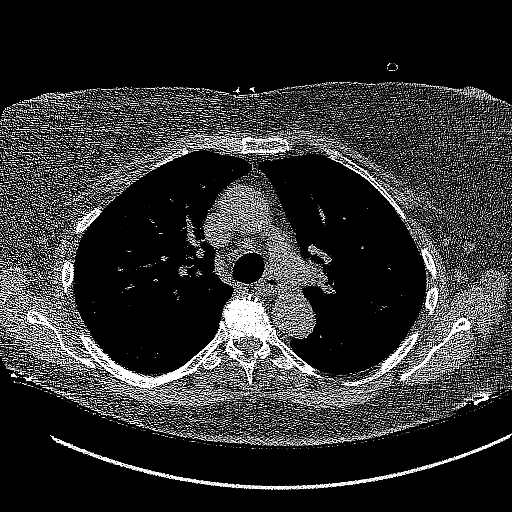
[im 238/350  lung]
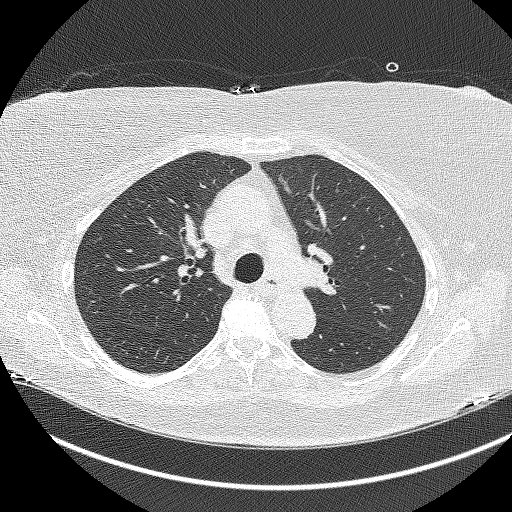
[im 270/350  lung]
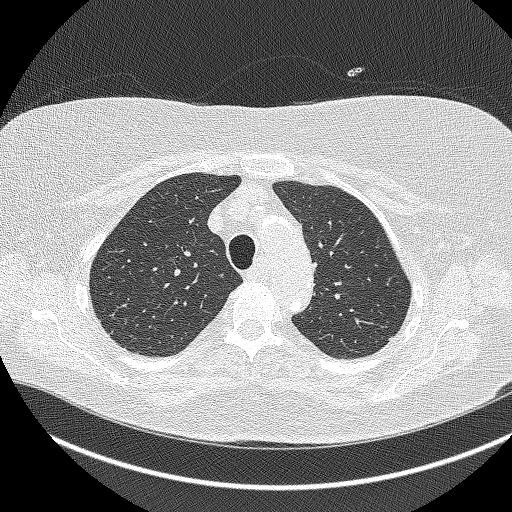
[im 302/350  lung]
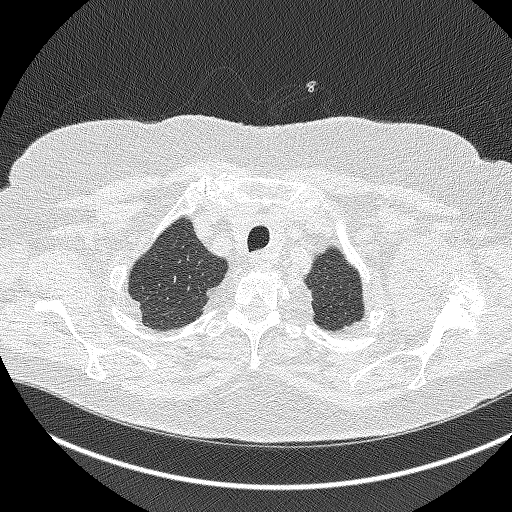
[im 334/350  lung]
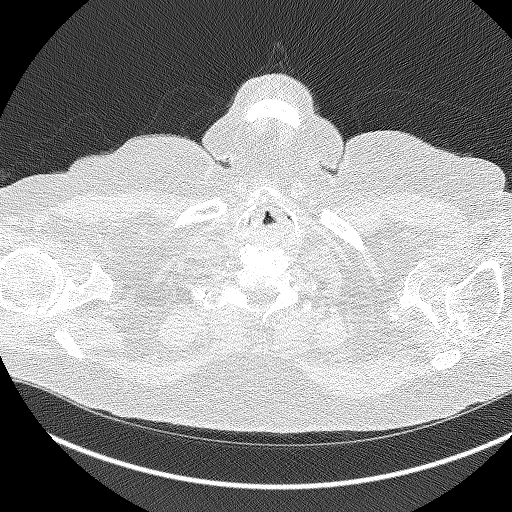

[Series 5: coronals lung 1.00 cor · coronal · 0.68mm/px · 3 of 283 slices shown]
[im 57/283  lung]
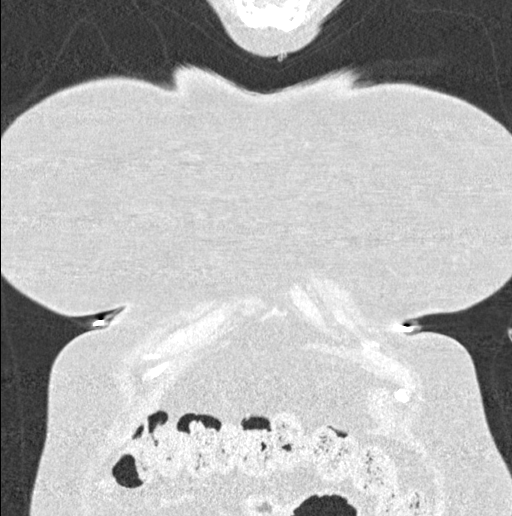
[im 113/283  lung]
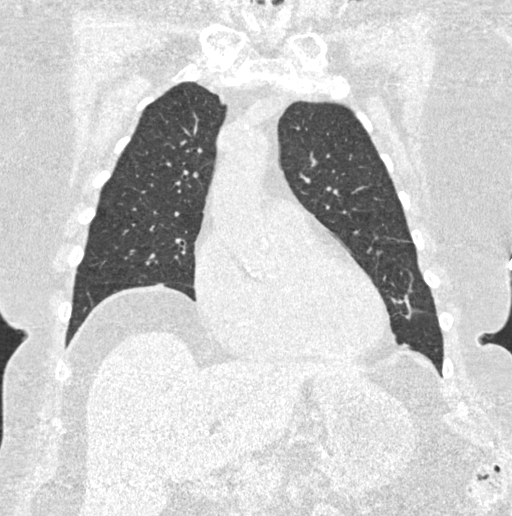
[im 170/283  lung]
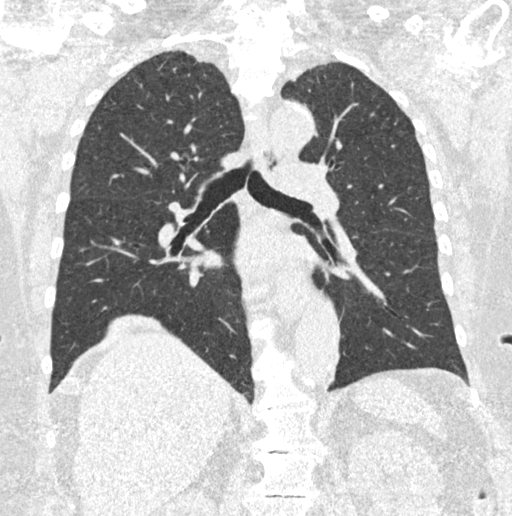

[15 of 40 positions shown; findings below may reference images not displayed]

FINDINGS: Cardiovascular: Heart size is normal. There is no significant
pericardial fluid, thickening or pericardial calcification. There is
aortic atherosclerosis, as well as atherosclerosis of the great
vessels of the mediastinum and the coronary arteries, including
calcified atherosclerotic plaque in the left main and left anterior
descending coronary arteries. Calcifications of the aortic valve.

Mediastinum/Nodes: No pathologically enlarged mediastinal or hilar
lymph nodes. Please note that accurate exclusion of hilar adenopathy
is limited on noncontrast CT scans. Esophagus is unremarkable in
appearance. No axillary lymphadenopathy.

Lungs/Pleura: Small pulmonary nodules are noted in the lungs
bilaterally, largest of which is in the posterior aspect of the left
upper lobe (axial image 68 of series 3) where there is a
pleural-based nodule with a volume derived mean diameter 5.7 mm. No
larger more suspicious appearing pulmonary nodules or masses are
noted. No acute consolidative airspace disease. No pleural
effusions. Areas of linear scarring are noted in the lungs
bilaterally, most evident in the medial segment of the right middle
lobe and inferior segment of the lingula.

Upper Abdomen: Aortic atherosclerosis. 3.8 cm low-attenuation lesion
in the upper pole the left kidney incompletely imaged and not
characterized on today's non-contrast CT examination, but
statistically likely to represent a cyst. Status post
cholecystectomy.

Musculoskeletal: There are no aggressive appearing lytic or blastic
lesions noted in the visualized portions of the skeleton.
IMPRESSION: 1. Lung-RADS 2S, benign appearance or behavior. Continue annual
screening with low-dose chest CT without contrast in 12 months.
2. The "S" modifier above refers to potentially clinically
significant non lung cancer related findings. Specifically, there is
aortic atherosclerosis, in addition to left main and left anterior
descending coronary artery disease. Assessment for potential risk
factor modification, dietary therapy or pharmacologic therapy may be
warranted, if clinically indicated.
3. Mild diffuse bronchial wall thickening with mild centrilobular
and paraseptal emphysema; imaging findings suggestive of underlying
COPD.
4. There are calcifications of the aortic valve. Echocardiographic
correlation for evaluation of potential valvular dysfunction may be
warranted if clinically indicated.

Aortic Atherosclerosis (HJSLV-W4B.B) and Emphysema (HJSLV-51S.2).

## 2023-03-14 ENCOUNTER — Ambulatory Visit (INDEPENDENT_AMBULATORY_CARE_PROVIDER_SITE_OTHER): Payer: Medicare Other

## 2023-03-14 ENCOUNTER — Encounter: Payer: Self-pay | Admitting: Acute Care

## 2023-03-14 DIAGNOSIS — I6523 Occlusion and stenosis of bilateral carotid arteries: Secondary | ICD-10-CM

## 2023-03-14 DIAGNOSIS — R0989 Other specified symptoms and signs involving the circulatory and respiratory systems: Secondary | ICD-10-CM

## 2023-03-14 DIAGNOSIS — R600 Localized edema: Secondary | ICD-10-CM

## 2023-03-18 LAB — VAS US ABI WITH/WO TBI
Left ABI: 0.83
Right ABI: 1.03

## 2023-03-22 ENCOUNTER — Ambulatory Visit (INDEPENDENT_AMBULATORY_CARE_PROVIDER_SITE_OTHER): Payer: Medicare Other | Admitting: Vascular Surgery

## 2023-03-29 ENCOUNTER — Ambulatory Visit (INDEPENDENT_AMBULATORY_CARE_PROVIDER_SITE_OTHER): Payer: Medicare Other | Admitting: Vascular Surgery

## 2023-03-29 ENCOUNTER — Encounter (INDEPENDENT_AMBULATORY_CARE_PROVIDER_SITE_OTHER): Payer: Self-pay | Admitting: Vascular Surgery

## 2023-03-29 VITALS — BP 148/98 | HR 92 | Resp 16

## 2023-03-29 DIAGNOSIS — I6523 Occlusion and stenosis of bilateral carotid arteries: Secondary | ICD-10-CM | POA: Diagnosis not present

## 2023-03-29 DIAGNOSIS — I70213 Atherosclerosis of native arteries of extremities with intermittent claudication, bilateral legs: Secondary | ICD-10-CM | POA: Diagnosis not present

## 2023-03-29 DIAGNOSIS — I10 Essential (primary) hypertension: Secondary | ICD-10-CM

## 2023-03-29 DIAGNOSIS — E785 Hyperlipidemia, unspecified: Secondary | ICD-10-CM | POA: Diagnosis not present

## 2023-03-29 DIAGNOSIS — I7025 Atherosclerosis of native arteries of other extremities with ulceration: Secondary | ICD-10-CM | POA: Insufficient documentation

## 2023-03-29 NOTE — H&P (View-Only) (Signed)
 MRN : [id number]  Erin Trujillo is a 78 y.o. (1945/06/22) female who presents with chief complaint of  Chief Complaint  Patient presents with   Follow-up    Ultrasound results  .  History of Present Illness: Patient returns today in follow up almost three years after her last visit.  Her son has noted that her feet have become much more purple.  She is much less active and her legs are dependent much of the time.  She developed an ulceration on the right ankle that was very slow to heal.  She has previously undergone iliac stent placement about 4 years ago.  Noninvasive studies were performed a few weeks ago demonstrating no evidence of DVT in either lower extremity as well as minimal carotid stenosis bilaterally.  She also had her arterial perfusion checked which demonstrated a significant drop in her ABIs particularly on the left side.  Her right ABI was still in the normal range at 1.03 with biphasic waveforms but she now had monophasic waveforms on the left with an ABI dropped down to 0.83.  Previously this was 1.1.  On the left side, she is monophasic all the way up at the common femoral artery indicative of iliac level disease.  Current Outpatient Medications  Medication Sig Dispense Refill   acetaminophen (TYLENOL) 500 MG tablet Take 500 mg by mouth.     ARIPiprazole (ABILIFY) 5 MG tablet Take by mouth.     aspirin EC 81 MG tablet Take 1 tablet (81 mg total) by mouth daily. 150 tablet 2   DULoxetine (CYMBALTA) 60 MG capsule Take 60 mg by mouth daily.     ELIQUIS 5 MG TABS tablet 5 mg 2 (two) times daily.   4   levalbuterol (XOPENEX HFA) 45 MCG/ACT inhaler Inhale into the lungs.     losartan-hydrochlorothiazide (HYZAAR) 100-25 MG tablet Take 1 tablet by mouth daily.     metoprolol succinate (TOPROL-XL) 100 MG 24 hr tablet Take 100 mg by mouth daily.     Multiple Vitamin (MULTIVITAMIN) capsule Take 1 capsule by mouth daily.     mupirocin ointment (BACTROBAN) 2 % SMARTSIG:1  Application Topical 2-3 Times Daily     simvastatin (ZOCOR) 40 MG tablet Take 40 mg by mouth daily at 6 PM.   0   amLODipine (NORVASC) 2.5 MG tablet Take by mouth.     amLODipine (NORVASC) 5 MG tablet Take 2.5 mg by mouth daily. (Patient not taking: Reported on 11/10/2020)     buPROPion (WELLBUTRIN XL) 300 MG 24 hr tablet Take 300 mg by mouth.  (Patient not taking: Reported on 12/04/2019)     cephALEXin (KEFLEX) 500 MG capsule Take 500 mg by mouth 3 (three) times daily. (Patient not taking: Reported on 06/20/2020)     COMBIVENT RESPIMAT 20-100 MCG/ACT AERS respimat Inhale 1 puff into the lungs 4 (four) times daily. (Patient not taking: Reported on 11/10/2020)     diclofenac sodium (VOLTAREN) 1 % GEL Apply topically. (Patient not taking: Reported on 11/10/2020)     ipratropium (ATROVENT) 0.06 % nasal spray Place into the nose.     primidone (MYSOLINE) 50 MG tablet Take 50 mg by mouth 2 (two) times daily.     No current facility-administered medications for this visit.    Past Medical History:  Diagnosis Date   Arthritis    COPD (chronic obstructive pulmonary disease) (HCC)    Hyperlipidemia    Hypertension    Squamous cell skin cancer  Past Surgical History:  Procedure Laterality Date   ABDOMINAL HYSTERECTOMY     APPENDECTOMY     CHOLECYSTECTOMY     LOWER EXTREMITY ANGIOGRAPHY Right 08/09/2019   Procedure: LOWER EXTREMITY ANGIOGRAPHY;  Surgeon: Annice Needy, MD;  Location: [facility];  Service: Cardiovascular;  Laterality: Right;   spine cyst removal     TONSILLECTOMY       Social History   Tobacco Use   Smoking status: Former    Current packs/day: 0.00    Average packs/day: 0.8 packs/day for 48.0 years (36.0 ttl pk-yrs)    Types: Cigarettes    Start date: 06/08/1972    Quit date: 06/08/2020    Years since quitting: 2.8   Smokeless tobacco: Former  Substance Use Topics   Alcohol use: No   Drug use: Never      Family History  Problem Relation Age of Onset    Arthritis Mother    Anxiety disorder Mother    Osteoporosis Mother    Hypertension Father    Stroke Father    Cancer Son    Breast cancer Neg Hx      Allergies  Allergen Reactions   Ciprofloxacin Nausea Only     REVIEW OF SYSTEMS (Negative unless checked)  Constitutional: [] Weight loss  [] Fever  [] Chills Cardiac: [] Chest pain   [] Chest pressure   [] Palpitations   [] Shortness of breath when laying flat   [] Shortness of breath at rest   [] Shortness of breath with exertion. Vascular:  [x] Pain in legs with walking   [] Pain in legs at rest   [] Pain in legs when laying flat   [] Claudication   [] Pain in feet when walking  [] Pain in feet at rest  [] Pain in feet when laying flat   [] History of DVT   [] Phlebitis   [] Swelling in legs   [] Varicose veins   [] Non-healing ulcers Pulmonary:   [] Uses home oxygen   [] Productive cough   [] Hemoptysis   [] Wheeze  [x] COPD   [] Asthma Neurologic:  [] Dizziness  [] Blackouts   [] Seizures   [] History of stroke   [] History of TIA  [] Aphasia   [] Temporary blindness   [] Dysphagia   [] Weakness or numbness in arms   [x] Weakness or numbness in legs Musculoskeletal:  [x] Arthritis   [] Joint swelling   [] Joint pain   [] Low back pain Hematologic:  [x] Easy bruising  [] Easy bleeding   [] Hypercoagulable state   [] Anemic   Gastrointestinal:  [] Blood in stool   [] Vomiting blood  [] Gastroesophageal reflux/heartburn   [] Abdominal pain Genitourinary:  [] Chronic kidney disease   [] Difficult urination  [] Frequent urination  [] Burning with urination   [] Hematuria Skin:  [] Rashes   [] Ulcers   [] Wounds Psychological:  [] History of anxiety   []  History of major depression.  Physical Examination  BP (!) 148/98   Pulse 92   Resp 16  Gen:  WD/WN, NAD Head: [place]/AT, No temporalis wasting. Ear/Nose/Throat: Hearing grossly intact, nares w/o erythema or drainage Eyes: Conjunctiva clear. Sclera non-icteric Neck: Supple.  Trachea midline Pulmonary:  Good air movement, no use of accessory  muscles on supplemental oxygen. Cardiac: RRR, no JVD Vascular:  Vessel Right Left  Radial Palpable Palpable                          PT 1+ Palpable 1+ Palpable  DP 2+ Palpable Not Palpable   Gastrointestinal: soft, non-tender/non-distended. No guarding/reflex.  Musculoskeletal: M/S 5/5 throughout.  No deformity or atrophy. 1+ BLE  edema. Neurologic: Sensation grossly intact in extremities.  Symmetrical.  Speech is fluent.  Psychiatric: Judgment intact, Mood & affect appropriate for pt's clinical situation. Dermatologic: No rashes or ulcers noted.  No cellulitis or open wounds.      Labs Recent Results (from the past 2160 hours)  Basic metabolic panel     Status: Abnormal   Collection Time: 02/19/23  2:11 PM  Result Value Ref Range   Sodium 135 135 - 145 mmol/L   Potassium 3.3 (L) 3.5 - 5.1 mmol/L   Chloride 95 (L) 98 - 111 mmol/L   CO2 29 22 - 32 mmol/L   Glucose, Bld 105 (H) 70 - 99 mg/dL    Comment: Glucose reference range applies only to samples taken after fasting for at least 8 hours.   BUN 19 8 - 23 mg/dL   Creatinine, Ser 1.61 0.44 - 1.00 mg/dL   Calcium 8.9 8.9 - 09.6 mg/dL   GFR, Estimated >04 >54 mL/min    Comment: (NOTE) Calculated using the CKD-EPI Creatinine Equation (2021)    Anion gap 11 5 - 15    Comment: Performed at [facility], [address] [place], [place] [address]  CBC     Status: Abnormal   Collection Time: 02/19/23  2:11 PM  Result Value Ref Range   WBC 7.9 4.0 - 10.5 K/uL   RBC 4.21 3.87 - 5.11 MIL/uL   Hemoglobin 13.5 12.0 - 15.0 g/dL   HCT 91.4 78.2 - 95.6 %   MCV 101.0 (H) 80.0 - 100.0 fL   MCH 32.1 26.0 - 34.0 pg   MCHC 31.8 30.0 - 36.0 g/dL   RDW 21.3 (H) 08.6 - 57.8 %   Platelets 268 150 - 400 K/uL   nRBC 0.0 0.0 - 0.2 %    Comment: Performed at [facility], [address] [place], [place] [address]  VAS [place] ABI WITH/WO TBI     Status: None   Collection Time: 03/14/23  3:53 PM  Result Value Ref Range    Right ABI 1.03    Left ABI .83     Radiology VAS [place] ABI WITH/WO TBI Result Date: 03/18/2023  LOWER EXTREMITY DOPPLER STUDY Patient Name:  MARVA HENDRYX  Date of Exam:   03/14/2023 Medical Rec #: [id number]         Accession #:    [id number] Date of Birth: January 18, 1946         Patient Gender: F Patient Age:   68 years Exam Location:  [facility] Procedure:      VAS [place] ABI WITH/WO TBI Referring Phys: JENNIFER HAGAN --------------------------------------------------------------------------------  Indications: Rest pain, and peripheral artery disease. Decreased pulses felt at              the level of the Ankle.  Vascular Interventions: Bilat CIA stents. Comparison Study: 06/23/2021 Performing Technologist: Debbe Bales RVS  Examination Guidelines: A complete evaluation includes at minimum, Doppler waveform signals and systolic blood pressure reading at the level of bilateral brachial, anterior tibial, and posterior tibial arteries, when vessel segments are accessible. Bilateral testing is considered an integral part of a complete examination. Photoelectric Plethysmograph (PPG) waveforms and toe systolic pressure readings are included as required and additional duplex testing as needed. Limited examinations for reoccurring indications may be performed as noted.  ABI Findings: +---------+------------------+-----+--------+--------+ Right    Rt Pressure (mmHg)IndexWaveformComment  +---------+------------------+-----+--------+--------+ Brachial 180                                     +---------+------------------+-----+--------+--------+  ATA      181               1.01 biphasic         +---------+------------------+-----+--------+--------+ PTA      186               1.03 biphasic         +---------+------------------+-----+--------+--------+ Great Toe110               0.61 Abnormal         +---------+------------------+-----+--------+--------+  +---------+------------------+-----+----------+-------+ Left     Lt Pressure (mmHg)IndexWaveform  Comment +---------+------------------+-----+----------+-------+ Brachial 176                                      +---------+------------------+-----+----------+-------+ ATA      139               0.77 monophasic        +---------+------------------+-----+----------+-------+ PTA      149               0.83 monophasic        +---------+------------------+-----+----------+-------+ Great Toe130               0.72 Normal            +---------+------------------+-----+----------+-------+ +-------+-----------+-----------+------------+------------+ ABI/TBIToday's ABIToday's TBIPrevious ABIPrevious TBI +-------+-----------+-----------+------------+------------+ Right  1.03       .61        1.12        .93          +-------+-----------+-----------+------------+------------+ Left   .83        .72        1.17        .98          +-------+-----------+-----------+------------+------------+ Bilateral ABIs appear decreased compared to prior study on 06/23/2021. Bilateral TBIs appear decreased compared to prior study on 06/23/2021.  Summary: Right: Resting right ankle-brachial index is within normal range. The right toe-brachial index is abnormal. Left: Resting left ankle-brachial index indicates mild left lower extremity arterial disease. The left toe-brachial index is normal. *See table(s) above for measurements and observations.  Electronically signed by Levora Dredge MD on 03/18/2023 at 7:21:44 AM.    Final    VAS [place] LOWER EXTREMITY ARTERIAL DUPLEX Result Date: 03/18/2023 LOWER EXTREMITY ARTERIAL DUPLEX STUDY Patient Name:  SAFIYYAH VASCONEZ  Date of Exam:   03/14/2023 Medical Rec #: [id number]         Accession #:    [id number] Date of Birth: [birth year]         Patient Gender: F Patient Age:   70 years Exam Location:  [facility] Procedure:      VAS [place] LOWER EXTREMITY  ARTERIAL DUPLEX Referring Phys: Victorino Dike HAGAN --------------------------------------------------------------------------------  Indications: Decreased pulses felt at the level of the Ankle.  Current ABI: Rt 1.03, Lt .83 Performing Technologist: Debbe Bales RVS  Examination Guidelines: A complete evaluation includes B-mode imaging, spectral Doppler, color Doppler, and power Doppler as needed of all accessible portions of each vessel. Bilateral testing is considered an integral part of a complete examination. Limited examinations for reoccurring indications may be performed as noted.  +-----------+--------+-----+--------+---------+--------+ RIGHT      PSV cm/sRatioStenosisWaveform Comments +-----------+--------+-----+--------+---------+--------+ CFA Distal 80                   triphasic         +-----------+--------+-----+--------+---------+--------+  DFA        59                   biphasic          +-----------+--------+-----+--------+---------+--------+ SFA Prox   61                   biphasic          +-----------+--------+-----+--------+---------+--------+ SFA Mid    55                   biphasic          +-----------+--------+-----+--------+---------+--------+ SFA Distal 53                   biphasic          +-----------+--------+-----+--------+---------+--------+ POP Distal 55                   triphasic         +-----------+--------+-----+--------+---------+--------+ ATA Distal 34                   biphasic          +-----------+--------+-----+--------+---------+--------+ PTA Distal 35                   triphasic         +-----------+--------+-----+--------+---------+--------+ PERO Distal22                   biphasic          +-----------+--------+-----+--------+---------+--------+  +-----------+--------+-----+--------+----------+--------+ LEFT       PSV cm/sRatioStenosisWaveform  Comments  +-----------+--------+-----+--------+----------+--------+ CFA Distal 46                   monophasic         +-----------+--------+-----+--------+----------+--------+ DFA        35                   monophasic         +-----------+--------+-----+--------+----------+--------+ SFA Prox   44                   monophasic         +-----------+--------+-----+--------+----------+--------+ SFA Mid    34                   monophasic         +-----------+--------+-----+--------+----------+--------+ SFA Distal 30                   monophasic         +-----------+--------+-----+--------+----------+--------+ POP Distal 27                   monophasic         +-----------+--------+-----+--------+----------+--------+ ATA Distal 27                   monophasic         +-----------+--------+-----+--------+----------+--------+ PTA Distal 24                   monophasic         +-----------+--------+-----+--------+----------+--------+ PERO Distal19                   monophasic         +-----------+--------+-----+--------+----------+--------+  Summary: Right: Imaging and Waveforms obtained throughout in the Right Lower Extremity. Biphasic Waveforms seen predominantly in the Right Lower Extremity. Left: Imaging and Waveforms obtained throughout in the Left Lower  Extremity. Monophasic Waveforms een throughout in the Left Lower Extremity.  See table(s) above for measurements and observations. Electronically signed by Levora Dredge MD on 03/18/2023 at 7:21:37 AM.    Final    VAS [place] LOWER EXTREMITY VENOUS (DVT) Result Date: 03/18/2023  Lower Venous DVT Study Patient Name:  IFEOMA VALLIN  Date of Exam:   03/14/2023 Medical Rec #: [id number]         Accession #:    [id number] Date of Birth: [birth year]         Patient Gender: F Patient Age:   44 years Exam Location:  [facility] Procedure:      VAS [place] LOWER EXTREMITY VENOUS (DVT) Referring Phys: Gwenevere Ghazi  --------------------------------------------------------------------------------  Indications: Swelling.  Performing Technologist: Debbe Bales RVS  Examination Guidelines: A complete evaluation includes B-mode imaging, spectral Doppler, color Doppler, and power Doppler as needed of all accessible portions of each vessel. Bilateral testing is considered an integral part of a complete examination. Limited examinations for reoccurring indications may be performed as noted. The reflux portion of the exam is performed with the patient in reverse Trendelenburg.  +---------+---------------+---------+-----------+----------+--------------+ RIGHT    CompressibilityPhasicitySpontaneityPropertiesThrombus Aging +---------+---------------+---------+-----------+----------+--------------+ CFV      Full           Yes      Yes                                 +---------+---------------+---------+-----------+----------+--------------+ SFJ      Full           Yes      Yes                                 +---------+---------------+---------+-----------+----------+--------------+ FV Prox  Full           Yes      Yes                                 +---------+---------------+---------+-----------+----------+--------------+ FV Mid   Full           Yes      Yes                                 +---------+---------------+---------+-----------+----------+--------------+ FV DistalFull           Yes      Yes                                 +---------+---------------+---------+-----------+----------+--------------+ PFV      Full           Yes      Yes                                 +---------+---------------+---------+-----------+----------+--------------+ POP      Full           Yes      Yes                                 +---------+---------------+---------+-----------+----------+--------------+ PTV  Full           Yes      Yes                                  +---------+---------------+---------+-----------+----------+--------------+ PERO     Full           Yes      Yes                                 +---------+---------------+---------+-----------+----------+--------------+ GSV      Full           Yes      Yes                                 +---------+---------------+---------+-----------+----------+--------------+ SSV      Full           Yes      Yes                                 +---------+---------------+---------+-----------+----------+--------------+   +---------+---------------+---------+-----------+----------+--------------+ LEFT     CompressibilityPhasicitySpontaneityPropertiesThrombus Aging +---------+---------------+---------+-----------+----------+--------------+ CFV      Full           Yes      Yes                                 +---------+---------------+---------+-----------+----------+--------------+ SFJ      Full           Yes      Yes                                 +---------+---------------+---------+-----------+----------+--------------+ FV Prox  Full           Yes      Yes                                 +---------+---------------+---------+-----------+----------+--------------+ FV Mid   Full           Yes      Yes                                 +---------+---------------+---------+-----------+----------+--------------+ FV DistalFull           Yes      Yes                                 +---------+---------------+---------+-----------+----------+--------------+ PFV      Full           Yes      Yes                                 +---------+---------------+---------+-----------+----------+--------------+ POP      Full           Yes      Yes                                 +---------+---------------+---------+-----------+----------+--------------+  PTV      Full           Yes      Yes                                  +---------+---------------+---------+-----------+----------+--------------+ PERO     Full           Yes      Yes                                 +---------+---------------+---------+-----------+----------+--------------+ GSV      Full           Yes      Yes                                 +---------+---------------+---------+-----------+----------+--------------+ SSV      Full           Yes      Yes                                 +---------+---------------+---------+-----------+----------+--------------+     Summary: BILATERAL: - No evidence of deep vein thrombosis seen in the lower extremities, bilaterally. - No evidence of superficial venous thrombosis in the lower extremities, bilaterally. -No evidence of popliteal cyst, bilaterally.  RIGHT: - No evidence of deep vein thrombosis in the lower extremity. No indirect evidence of obstruction proximal to the inguinal ligament.   LEFT: - No evidence of deep vein thrombosis in the lower extremity. No indirect evidence of obstruction proximal to the inguinal ligament.   *See table(s) above for measurements and observations. Electronically signed by Levora Dredge MD on 03/18/2023 at 7:21:30 AM.    Final    VAS US CAROTID Result Date: 03/18/2023 Carotid Arterial Duplex Study Patient Name:  ZAYNEB BAUCUM  Date of Exam:   03/14/2023 Medical Rec #: [id number]         Accession #:    [id number] Date of Birth: 1945/05/30         Patient Gender: F Patient Age:   70 years Exam Location:  [facility] Procedure:      VAS US CAROTID Referring Phys: Sheppard Plumber --------------------------------------------------------------------------------  Indications:       Carotid artery disease. Comparison Study:  06/23/2021 Performing Technologist: Debbe Bales RVS  Examination Guidelines: A complete evaluation includes B-mode imaging, spectral Doppler, color Doppler, and power Doppler as needed of all accessible portions of each vessel. Bilateral testing  is considered an integral part of a complete examination. Limited examinations for reoccurring indications may be performed as noted.  Right Carotid Findings: +----------+--------+--------+--------+------------------+--------+           PSV cm/sEDV cm/sStenosisPlaque DescriptionComments +----------+--------+--------+--------+------------------+--------+ CCA Prox  55      8                                          +----------+--------+--------+--------+------------------+--------+ CCA Mid   56      12                                         +----------+--------+--------+--------+------------------+--------+  CCA Distal35      10                                         +----------+--------+--------+--------+------------------+--------+ ICA Prox  27      6                                          +----------+--------+--------+--------+------------------+--------+ ICA Mid   46      16                                         +----------+--------+--------+--------+------------------+--------+ ICA Distal50      12                                         +----------+--------+--------+--------+------------------+--------+ ECA       45      7                                          +----------+--------+--------+--------+------------------+--------+ +----------+--------+-------+--------+-------------------+           PSV cm/sEDV cmsDescribeArm Pressure (mmHG) +----------+--------+-------+--------+-------------------+ [id number]      0                                  +----------+--------+-------+--------+-------------------+ +---------+--------+--+--------+-+ VertebralPSV cm/s10EDV cm/s0 +---------+--------+--+--------+-+  Left Carotid Findings: +----------+--------+--------+--------+------------------+--------+           PSV cm/sEDV cm/sStenosisPlaque DescriptionComments +----------+--------+--------+--------+------------------+--------+ CCA  Prox  54      9                                          +----------+--------+--------+--------+------------------+--------+ CCA Mid   48      14                                         +----------+--------+--------+--------+------------------+--------+ CCA Distal44      9                                          +----------+--------+--------+--------+------------------+--------+ ICA Prox  57      14                                         +----------+--------+--------+--------+------------------+--------+ ICA Mid   49      9                                          +----------+--------+--------+--------+------------------+--------+  ICA Distal62      19                                         +----------+--------+--------+--------+------------------+--------+ ECA       121     16                                         +----------+--------+--------+--------+------------------+--------+ +----------+--------+--------+--------+-------------------+           PSV cm/sEDV cm/sDescribeArm Pressure (mmHG) +----------+--------+--------+--------+-------------------+ [id number]      0                                   +----------+--------+--------+--------+-------------------+ +---------+--------+--+--------+--+ VertebralPSV cm/s49EDV cm/s11 +---------+--------+--+--------+--+   Summary: Right Carotid: Velocities in the right ICA are consistent with a 1-39% stenosis. Left Carotid: Velocities in the left ICA are consistent with a 1-39% stenosis. Vertebrals:  Left vertebral artery demonstrates antegrade flow. Right vertebral              artery demonstrates retrograde flow. Subclavians: Normal flow hemodynamics were seen in bilateral subclavian              arteries. *See table(s) above for measurements and observations.  Electronically signed by Levora Dredge MD on 03/18/2023 at 7:21:10 AM.    Final     Assessment/Plan  Atherosclerosis of native arteries  of the extremities with ulceration (HCC) She also had her arterial perfusion checked which demonstrated a significant drop in her ABIs particularly on the left side.  Her right ABI was still in the normal range at 1.03 with biphasic waveforms but she now had monophasic waveforms on the left with an ABI dropped down to 0.83.  Previously this was 1.1.  On the left side, she is monophasic all the way up at the common femoral artery indicative of iliac level disease.  This is a severe and worrisome problem with slow to heal ulceration and marked skin changes.  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  Angiography with possible revascularization will be planned in the near future at her convenience.  Carotid atherosclerosis, bilateral 1 to 39% bilaterally.  This can be checked every year or 2.  Essential hypertension blood pressure control important in reducing the progression of atherosclerotic disease. On appropriate oral medications.     Hyperlipidemia lipid control important in reducing the progression of atherosclerotic disease. Continue statin therapy  Festus Barren, MD  03/29/2023 5:21 PM    This note was created with Dragon medical transcription system.  Any errors from dictation are purely unintentional

## 2023-03-29 NOTE — Assessment & Plan Note (Signed)
She also had her arterial perfusion checked which demonstrated a significant drop in her ABIs particularly on the left side.  Her right ABI was still in the normal range at 1.03 with biphasic waveforms but she now had monophasic waveforms on the left with an ABI dropped down to 0.83.  Previously this was 1.1.  On the left side, she is monophasic all the way up at the common femoral artery indicative of iliac level disease.  This is a severe and worrisome problem with slow to heal ulceration and marked skin changes.  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  Angiography with possible revascularization will be planned in the near future at her convenience.

## 2023-03-29 NOTE — Progress Notes (Signed)
MRN : [id number]  Erin Trujillo is a 78 y.o. (1945/06/22) female who presents with chief complaint of  Chief Complaint  Patient presents with   Follow-up    Ultrasound results  .  History of Present Illness: Patient returns today in follow up almost three years after her last visit.  Her son has noted that her feet have become much more purple.  She is much less active and her legs are dependent much of the time.  She developed an ulceration on the right ankle that was very slow to heal.  She has previously undergone iliac stent placement about 4 years ago.  Noninvasive studies were performed a few weeks ago demonstrating no evidence of DVT in either lower extremity as well as minimal carotid stenosis bilaterally.  She also had her arterial perfusion checked which demonstrated a significant drop in her ABIs particularly on the left side.  Her right ABI was still in the normal range at 1.03 with biphasic waveforms but she now had monophasic waveforms on the left with an ABI dropped down to 0.83.  Previously this was 1.1.  On the left side, she is monophasic all the way up at the common femoral artery indicative of iliac level disease.  Current Outpatient Medications  Medication Sig Dispense Refill   acetaminophen (TYLENOL) 500 MG tablet Take 500 mg by mouth.     ARIPiprazole (ABILIFY) 5 MG tablet Take by mouth.     aspirin EC 81 MG tablet Take 1 tablet (81 mg total) by mouth daily. 150 tablet 2   DULoxetine (CYMBALTA) 60 MG capsule Take 60 mg by mouth daily.     ELIQUIS 5 MG TABS tablet 5 mg 2 (two) times daily.   4   levalbuterol (XOPENEX HFA) 45 MCG/ACT inhaler Inhale into the lungs.     losartan-hydrochlorothiazide (HYZAAR) 100-25 MG tablet Take 1 tablet by mouth daily.     metoprolol succinate (TOPROL-XL) 100 MG 24 hr tablet Take 100 mg by mouth daily.     Multiple Vitamin (MULTIVITAMIN) capsule Take 1 capsule by mouth daily.     mupirocin ointment (BACTROBAN) 2 % SMARTSIG:1  Application Topical 2-3 Times Daily     simvastatin (ZOCOR) 40 MG tablet Take 40 mg by mouth daily at 6 PM.   0   amLODipine (NORVASC) 2.5 MG tablet Take by mouth.     amLODipine (NORVASC) 5 MG tablet Take 2.5 mg by mouth daily. (Patient not taking: Reported on 11/10/2020)     buPROPion (WELLBUTRIN XL) 300 MG 24 hr tablet Take 300 mg by mouth.  (Patient not taking: Reported on 12/04/2019)     cephALEXin (KEFLEX) 500 MG capsule Take 500 mg by mouth 3 (three) times daily. (Patient not taking: Reported on 06/20/2020)     COMBIVENT RESPIMAT 20-100 MCG/ACT AERS respimat Inhale 1 puff into the lungs 4 (four) times daily. (Patient not taking: Reported on 11/10/2020)     diclofenac sodium (VOLTAREN) 1 % GEL Apply topically. (Patient not taking: Reported on 11/10/2020)     ipratropium (ATROVENT) 0.06 % nasal spray Place into the nose.     primidone (MYSOLINE) 50 MG tablet Take 50 mg by mouth 2 (two) times daily.     No current facility-administered medications for this visit.    Past Medical History:  Diagnosis Date   Arthritis    COPD (chronic obstructive pulmonary disease) (HCC)    Hyperlipidemia    Hypertension    Squamous cell skin cancer  Past Surgical History:  Procedure Laterality Date   ABDOMINAL HYSTERECTOMY     APPENDECTOMY     CHOLECYSTECTOMY     LOWER EXTREMITY ANGIOGRAPHY Right 08/09/2019   Procedure: LOWER EXTREMITY ANGIOGRAPHY;  Surgeon: Annice Needy, MD;  Location: [facility];  Service: Cardiovascular;  Laterality: Right;   spine cyst removal     TONSILLECTOMY       Social History   Tobacco Use   Smoking status: Former    Current packs/day: 0.00    Average packs/day: 0.8 packs/day for 48.0 years (36.0 ttl pk-yrs)    Types: Cigarettes    Start date: 06/08/1972    Quit date: 06/08/2020    Years since quitting: 2.8   Smokeless tobacco: Former  Substance Use Topics   Alcohol use: No   Drug use: Never      Family History  Problem Relation Age of Onset    Arthritis Mother    Anxiety disorder Mother    Osteoporosis Mother    Hypertension Father    Stroke Father    Cancer Son    Breast cancer Neg Hx      Allergies  Allergen Reactions   Ciprofloxacin Nausea Only     REVIEW OF SYSTEMS (Negative unless checked)  Constitutional: [] Weight loss  [] Fever  [] Chills Cardiac: [] Chest pain   [] Chest pressure   [] Palpitations   [] Shortness of breath when laying flat   [] Shortness of breath at rest   [] Shortness of breath with exertion. Vascular:  [x] Pain in legs with walking   [] Pain in legs at rest   [] Pain in legs when laying flat   [] Claudication   [] Pain in feet when walking  [] Pain in feet at rest  [] Pain in feet when laying flat   [] History of DVT   [] Phlebitis   [] Swelling in legs   [] Varicose veins   [] Non-healing ulcers Pulmonary:   [] Uses home oxygen   [] Productive cough   [] Hemoptysis   [] Wheeze  [x] COPD   [] Asthma Neurologic:  [] Dizziness  [] Blackouts   [] Seizures   [] History of stroke   [] History of TIA  [] Aphasia   [] Temporary blindness   [] Dysphagia   [] Weakness or numbness in arms   [x] Weakness or numbness in legs Musculoskeletal:  [x] Arthritis   [] Joint swelling   [] Joint pain   [] Low back pain Hematologic:  [x] Easy bruising  [] Easy bleeding   [] Hypercoagulable state   [] Anemic   Gastrointestinal:  [] Blood in stool   [] Vomiting blood  [] Gastroesophageal reflux/heartburn   [] Abdominal pain Genitourinary:  [] Chronic kidney disease   [] Difficult urination  [] Frequent urination  [] Burning with urination   [] Hematuria Skin:  [] Rashes   [] Ulcers   [] Wounds Psychological:  [] History of anxiety   []  History of major depression.  Physical Examination  BP (!) 148/98   Pulse 92   Resp 16  Gen:  WD/WN, NAD Head: [place]/AT, No temporalis wasting. Ear/Nose/Throat: Hearing grossly intact, nares w/o erythema or drainage Eyes: Conjunctiva clear. Sclera non-icteric Neck: Supple.  Trachea midline Pulmonary:  Good air movement, no use of accessory  muscles on supplemental oxygen. Cardiac: RRR, no JVD Vascular:  Vessel Right Left  Radial Palpable Palpable                          PT 1+ Palpable 1+ Palpable  DP 2+ Palpable Not Palpable   Gastrointestinal: soft, non-tender/non-distended. No guarding/reflex.  Musculoskeletal: M/S 5/5 throughout.  No deformity or atrophy. 1+ BLE  edema. Neurologic: Sensation grossly intact in extremities.  Symmetrical.  Speech is fluent.  Psychiatric: Judgment intact, Mood & affect appropriate for pt's clinical situation. Dermatologic: No rashes or ulcers noted.  No cellulitis or open wounds.      Labs Recent Results (from the past 2160 hours)  Basic metabolic panel     Status: Abnormal   Collection Time: 02/19/23  2:11 PM  Result Value Ref Range   Sodium 135 135 - 145 mmol/L   Potassium 3.3 (L) 3.5 - 5.1 mmol/L   Chloride 95 (L) 98 - 111 mmol/L   CO2 29 22 - 32 mmol/L   Glucose, Bld 105 (H) 70 - 99 mg/dL    Comment: Glucose reference range applies only to samples taken after fasting for at least 8 hours.   BUN 19 8 - 23 mg/dL   Creatinine, Ser 1.61 0.44 - 1.00 mg/dL   Calcium 8.9 8.9 - 09.6 mg/dL   GFR, Estimated >04 >54 mL/min    Comment: (NOTE) Calculated using the CKD-EPI Creatinine Equation (2021)    Anion gap 11 5 - 15    Comment: Performed at [facility], [address] [place], [place] [address]  CBC     Status: Abnormal   Collection Time: 02/19/23  2:11 PM  Result Value Ref Range   WBC 7.9 4.0 - 10.5 K/uL   RBC 4.21 3.87 - 5.11 MIL/uL   Hemoglobin 13.5 12.0 - 15.0 g/dL   HCT 91.4 78.2 - 95.6 %   MCV 101.0 (H) 80.0 - 100.0 fL   MCH 32.1 26.0 - 34.0 pg   MCHC 31.8 30.0 - 36.0 g/dL   RDW 21.3 (H) 08.6 - 57.8 %   Platelets 268 150 - 400 K/uL   nRBC 0.0 0.0 - 0.2 %    Comment: Performed at [facility], [address] [place], [place] [address]  VAS [place] ABI WITH/WO TBI     Status: None   Collection Time: 03/14/23  3:53 PM  Result Value Ref Range    Right ABI 1.03    Left ABI .83     Radiology VAS [place] ABI WITH/WO TBI Result Date: 03/18/2023  LOWER EXTREMITY DOPPLER STUDY Patient Name:  Erin Trujillo  Date of Exam:   03/14/2023 Medical Rec #: [id number]         Accession #:    [id number] Date of Birth: January 18, 1946         Patient Gender: F Patient Age:   68 years Exam Location:  [facility] Procedure:      VAS [place] ABI WITH/WO TBI Referring Phys: JENNIFER HAGAN --------------------------------------------------------------------------------  Indications: Rest pain, and peripheral artery disease. Decreased pulses felt at              the level of the Ankle.  Vascular Interventions: Bilat CIA stents. Comparison Study: 06/23/2021 Performing Technologist: Debbe Bales RVS  Examination Guidelines: A complete evaluation includes at minimum, Doppler waveform signals and systolic blood pressure reading at the level of bilateral brachial, anterior tibial, and posterior tibial arteries, when vessel segments are accessible. Bilateral testing is considered an integral part of a complete examination. Photoelectric Plethysmograph (PPG) waveforms and toe systolic pressure readings are included as required and additional duplex testing as needed. Limited examinations for reoccurring indications may be performed as noted.  ABI Findings: +---------+------------------+-----+--------+--------+ Right    Rt Pressure (mmHg)IndexWaveformComment  +---------+------------------+-----+--------+--------+ Brachial 180                                     +---------+------------------+-----+--------+--------+  ATA      181               1.01 biphasic         +---------+------------------+-----+--------+--------+ PTA      186               1.03 biphasic         +---------+------------------+-----+--------+--------+ Great Toe110               0.61 Abnormal         +---------+------------------+-----+--------+--------+  +---------+------------------+-----+----------+-------+ Left     Lt Pressure (mmHg)IndexWaveform  Comment +---------+------------------+-----+----------+-------+ Brachial 176                                      +---------+------------------+-----+----------+-------+ ATA      139               0.77 monophasic        +---------+------------------+-----+----------+-------+ PTA      149               0.83 monophasic        +---------+------------------+-----+----------+-------+ Great Toe130               0.72 Normal            +---------+------------------+-----+----------+-------+ +-------+-----------+-----------+------------+------------+ ABI/TBIToday's ABIToday's TBIPrevious ABIPrevious TBI +-------+-----------+-----------+------------+------------+ Right  1.03       .61        1.12        .93          +-------+-----------+-----------+------------+------------+ Left   .83        .72        1.17        .98          +-------+-----------+-----------+------------+------------+ Bilateral ABIs appear decreased compared to prior study on 06/23/2021. Bilateral TBIs appear decreased compared to prior study on 06/23/2021.  Summary: Right: Resting right ankle-brachial index is within normal range. The right toe-brachial index is abnormal. Left: Resting left ankle-brachial index indicates mild left lower extremity arterial disease. The left toe-brachial index is normal. *See table(s) above for measurements and observations.  Electronically signed by Levora Dredge MD on 03/18/2023 at 7:21:44 AM.    Final    VAS [place] LOWER EXTREMITY ARTERIAL DUPLEX Result Date: 03/18/2023 LOWER EXTREMITY ARTERIAL DUPLEX STUDY Patient Name:  Erin Trujillo  Date of Exam:   03/14/2023 Medical Rec #: [id number]         Accession #:    [id number] Date of Birth: [birth year]         Patient Gender: F Patient Age:   70 years Exam Location:  [facility] Procedure:      VAS [place] LOWER EXTREMITY  ARTERIAL DUPLEX Referring Phys: Victorino Dike HAGAN --------------------------------------------------------------------------------  Indications: Decreased pulses felt at the level of the Ankle.  Current ABI: Rt 1.03, Lt .83 Performing Technologist: Debbe Bales RVS  Examination Guidelines: A complete evaluation includes B-mode imaging, spectral Doppler, color Doppler, and power Doppler as needed of all accessible portions of each vessel. Bilateral testing is considered an integral part of a complete examination. Limited examinations for reoccurring indications may be performed as noted.  +-----------+--------+-----+--------+---------+--------+ RIGHT      PSV cm/sRatioStenosisWaveform Comments +-----------+--------+-----+--------+---------+--------+ CFA Distal 80                   triphasic         +-----------+--------+-----+--------+---------+--------+  DFA        59                   biphasic          +-----------+--------+-----+--------+---------+--------+ SFA Prox   61                   biphasic          +-----------+--------+-----+--------+---------+--------+ SFA Mid    55                   biphasic          +-----------+--------+-----+--------+---------+--------+ SFA Distal 53                   biphasic          +-----------+--------+-----+--------+---------+--------+ POP Distal 55                   triphasic         +-----------+--------+-----+--------+---------+--------+ ATA Distal 34                   biphasic          +-----------+--------+-----+--------+---------+--------+ PTA Distal 35                   triphasic         +-----------+--------+-----+--------+---------+--------+ PERO Distal22                   biphasic          +-----------+--------+-----+--------+---------+--------+  +-----------+--------+-----+--------+----------+--------+ LEFT       PSV cm/sRatioStenosisWaveform  Comments  +-----------+--------+-----+--------+----------+--------+ CFA Distal 46                   monophasic         +-----------+--------+-----+--------+----------+--------+ DFA        35                   monophasic         +-----------+--------+-----+--------+----------+--------+ SFA Prox   44                   monophasic         +-----------+--------+-----+--------+----------+--------+ SFA Mid    34                   monophasic         +-----------+--------+-----+--------+----------+--------+ SFA Distal 30                   monophasic         +-----------+--------+-----+--------+----------+--------+ POP Distal 27                   monophasic         +-----------+--------+-----+--------+----------+--------+ ATA Distal 27                   monophasic         +-----------+--------+-----+--------+----------+--------+ PTA Distal 24                   monophasic         +-----------+--------+-----+--------+----------+--------+ PERO Distal19                   monophasic         +-----------+--------+-----+--------+----------+--------+  Summary: Right: Imaging and Waveforms obtained throughout in the Right Lower Extremity. Biphasic Waveforms seen predominantly in the Right Lower Extremity. Left: Imaging and Waveforms obtained throughout in the Left Lower  Extremity. Monophasic Waveforms een throughout in the Left Lower Extremity.  See table(s) above for measurements and observations. Electronically signed by Levora Dredge MD on 03/18/2023 at 7:21:37 AM.    Final    VAS [place] LOWER EXTREMITY VENOUS (DVT) Result Date: 03/18/2023  Lower Venous DVT Study Patient Name:  IFEOMA VALLIN  Date of Exam:   03/14/2023 Medical Rec #: [id number]         Accession #:    [id number] Date of Birth: [birth year]         Patient Gender: F Patient Age:   44 years Exam Location:  [facility] Procedure:      VAS [place] LOWER EXTREMITY VENOUS (DVT) Referring Phys: Gwenevere Ghazi  --------------------------------------------------------------------------------  Indications: Swelling.  Performing Technologist: Debbe Bales RVS  Examination Guidelines: A complete evaluation includes B-mode imaging, spectral Doppler, color Doppler, and power Doppler as needed of all accessible portions of each vessel. Bilateral testing is considered an integral part of a complete examination. Limited examinations for reoccurring indications may be performed as noted. The reflux portion of the exam is performed with the patient in reverse Trendelenburg.  +---------+---------------+---------+-----------+----------+--------------+ RIGHT    CompressibilityPhasicitySpontaneityPropertiesThrombus Aging +---------+---------------+---------+-----------+----------+--------------+ CFV      Full           Yes      Yes                                 +---------+---------------+---------+-----------+----------+--------------+ SFJ      Full           Yes      Yes                                 +---------+---------------+---------+-----------+----------+--------------+ FV Prox  Full           Yes      Yes                                 +---------+---------------+---------+-----------+----------+--------------+ FV Mid   Full           Yes      Yes                                 +---------+---------------+---------+-----------+----------+--------------+ FV DistalFull           Yes      Yes                                 +---------+---------------+---------+-----------+----------+--------------+ PFV      Full           Yes      Yes                                 +---------+---------------+---------+-----------+----------+--------------+ POP      Full           Yes      Yes                                 +---------+---------------+---------+-----------+----------+--------------+ PTV  Full           Yes      Yes                                  +---------+---------------+---------+-----------+----------+--------------+ PERO     Full           Yes      Yes                                 +---------+---------------+---------+-----------+----------+--------------+ GSV      Full           Yes      Yes                                 +---------+---------------+---------+-----------+----------+--------------+ SSV      Full           Yes      Yes                                 +---------+---------------+---------+-----------+----------+--------------+   +---------+---------------+---------+-----------+----------+--------------+ LEFT     CompressibilityPhasicitySpontaneityPropertiesThrombus Aging +---------+---------------+---------+-----------+----------+--------------+ CFV      Full           Yes      Yes                                 +---------+---------------+---------+-----------+----------+--------------+ SFJ      Full           Yes      Yes                                 +---------+---------------+---------+-----------+----------+--------------+ FV Prox  Full           Yes      Yes                                 +---------+---------------+---------+-----------+----------+--------------+ FV Mid   Full           Yes      Yes                                 +---------+---------------+---------+-----------+----------+--------------+ FV DistalFull           Yes      Yes                                 +---------+---------------+---------+-----------+----------+--------------+ PFV      Full           Yes      Yes                                 +---------+---------------+---------+-----------+----------+--------------+ POP      Full           Yes      Yes                                 +---------+---------------+---------+-----------+----------+--------------+  PTV      Full           Yes      Yes                                  +---------+---------------+---------+-----------+----------+--------------+ PERO     Full           Yes      Yes                                 +---------+---------------+---------+-----------+----------+--------------+ GSV      Full           Yes      Yes                                 +---------+---------------+---------+-----------+----------+--------------+ SSV      Full           Yes      Yes                                 +---------+---------------+---------+-----------+----------+--------------+     Summary: BILATERAL: - No evidence of deep vein thrombosis seen in the lower extremities, bilaterally. - No evidence of superficial venous thrombosis in the lower extremities, bilaterally. -No evidence of popliteal cyst, bilaterally.  RIGHT: - No evidence of deep vein thrombosis in the lower extremity. No indirect evidence of obstruction proximal to the inguinal ligament.   LEFT: - No evidence of deep vein thrombosis in the lower extremity. No indirect evidence of obstruction proximal to the inguinal ligament.   *See table(s) above for measurements and observations. Electronically signed by Levora Dredge MD on 03/18/2023 at 7:21:30 AM.    Final    VAS US CAROTID Result Date: 03/18/2023 Carotid Arterial Duplex Study Patient Name:  Erin Trujillo  Date of Exam:   03/14/2023 Medical Rec #: [id number]         Accession #:    [id number] Date of Birth: 1945/05/30         Patient Gender: F Patient Age:   70 years Exam Location:  [facility] Procedure:      VAS US CAROTID Referring Phys: Sheppard Plumber --------------------------------------------------------------------------------  Indications:       Carotid artery disease. Comparison Study:  06/23/2021 Performing Technologist: Debbe Bales RVS  Examination Guidelines: A complete evaluation includes B-mode imaging, spectral Doppler, color Doppler, and power Doppler as needed of all accessible portions of each vessel. Bilateral testing  is considered an integral part of a complete examination. Limited examinations for reoccurring indications may be performed as noted.  Right Carotid Findings: +----------+--------+--------+--------+------------------+--------+           PSV cm/sEDV cm/sStenosisPlaque DescriptionComments +----------+--------+--------+--------+------------------+--------+ CCA Prox  55      8                                          +----------+--------+--------+--------+------------------+--------+ CCA Mid   56      12                                         +----------+--------+--------+--------+------------------+--------+  CCA Distal35      10                                         +----------+--------+--------+--------+------------------+--------+ ICA Prox  27      6                                          +----------+--------+--------+--------+------------------+--------+ ICA Mid   46      16                                         +----------+--------+--------+--------+------------------+--------+ ICA Distal50      12                                         +----------+--------+--------+--------+------------------+--------+ ECA       45      7                                          +----------+--------+--------+--------+------------------+--------+ +----------+--------+-------+--------+-------------------+           PSV cm/sEDV cmsDescribeArm Pressure (mmHG) +----------+--------+-------+--------+-------------------+ [id number]      0                                  +----------+--------+-------+--------+-------------------+ +---------+--------+--+--------+-+ VertebralPSV cm/s10EDV cm/s0 +---------+--------+--+--------+-+  Left Carotid Findings: +----------+--------+--------+--------+------------------+--------+           PSV cm/sEDV cm/sStenosisPlaque DescriptionComments +----------+--------+--------+--------+------------------+--------+ CCA  Prox  54      9                                          +----------+--------+--------+--------+------------------+--------+ CCA Mid   48      14                                         +----------+--------+--------+--------+------------------+--------+ CCA Distal44      9                                          +----------+--------+--------+--------+------------------+--------+ ICA Prox  57      14                                         +----------+--------+--------+--------+------------------+--------+ ICA Mid   49      9                                          +----------+--------+--------+--------+------------------+--------+  ICA Distal62      19                                         +----------+--------+--------+--------+------------------+--------+ ECA       121     16                                         +----------+--------+--------+--------+------------------+--------+ +----------+--------+--------+--------+-------------------+           PSV cm/sEDV cm/sDescribeArm Pressure (mmHG) +----------+--------+--------+--------+-------------------+ [id number]      0                                   +----------+--------+--------+--------+-------------------+ +---------+--------+--+--------+--+ VertebralPSV cm/s49EDV cm/s11 +---------+--------+--+--------+--+   Summary: Right Carotid: Velocities in the right ICA are consistent with a 1-39% stenosis. Left Carotid: Velocities in the left ICA are consistent with a 1-39% stenosis. Vertebrals:  Left vertebral artery demonstrates antegrade flow. Right vertebral              artery demonstrates retrograde flow. Subclavians: Normal flow hemodynamics were seen in bilateral subclavian              arteries. *See table(s) above for measurements and observations.  Electronically signed by Levora Dredge MD on 03/18/2023 at 7:21:10 AM.    Final     Assessment/Plan  Atherosclerosis of native arteries  of the extremities with ulceration (HCC) She also had her arterial perfusion checked which demonstrated a significant drop in her ABIs particularly on the left side.  Her right ABI was still in the normal range at 1.03 with biphasic waveforms but she now had monophasic waveforms on the left with an ABI dropped down to 0.83.  Previously this was 1.1.  On the left side, she is monophasic all the way up at the common femoral artery indicative of iliac level disease.  This is a severe and worrisome problem with slow to heal ulceration and marked skin changes.  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  Angiography with possible revascularization will be planned in the near future at her convenience.  Carotid atherosclerosis, bilateral 1 to 39% bilaterally.  This can be checked every year or 2.  Essential hypertension blood pressure control important in reducing the progression of atherosclerotic disease. On appropriate oral medications.     Hyperlipidemia lipid control important in reducing the progression of atherosclerotic disease. Continue statin therapy  Festus Barren, MD  03/29/2023 5:21 PM    This note was created with Dragon medical transcription system.  Any errors from dictation are purely unintentional

## 2023-03-29 NOTE — Assessment & Plan Note (Signed)
1 to 39% bilaterally.  This can be checked every year or 2.

## 2023-03-31 ENCOUNTER — Telehealth (INDEPENDENT_AMBULATORY_CARE_PROVIDER_SITE_OTHER): Payer: Self-pay

## 2023-03-31 NOTE — Telephone Encounter (Signed)
I attempted to contact the patient to schedule a bilateral iliac stents placement with Dr. Wyn Quaker on 04/07/23 with a 9:15 am arrival time to the [facility]. Pre-procedure instructions will be sent to Mychart and mailed. Spoke with the patient's son and discussed the pre-procedure instructions. Per the son the instructions were faxed to attention Bonita Quin at [facility] as well.

## 2023-04-07 ENCOUNTER — Encounter: Payer: Self-pay | Admitting: Vascular Surgery

## 2023-04-07 ENCOUNTER — Encounter
Admission: RE | Disposition: A | Discharge status: Home / Self Care | Payer: Self-pay | Source: Home / Self Care | Attending: Vascular Surgery

## 2023-04-07 ENCOUNTER — Other Ambulatory Visit: Payer: Self-pay

## 2023-04-07 ENCOUNTER — Ambulatory Visit
Admission: RE | Admit: 2023-04-07 | Discharge: 2023-04-07 | Disposition: A | Discharge status: Home / Self Care | Payer: Medicare Other | Attending: Vascular Surgery | Admitting: Vascular Surgery

## 2023-04-07 DIAGNOSIS — I6523 Occlusion and stenosis of bilateral carotid arteries: Secondary | ICD-10-CM | POA: Diagnosis not present

## 2023-04-07 DIAGNOSIS — Z87891 Personal history of nicotine dependence: Secondary | ICD-10-CM | POA: Diagnosis not present

## 2023-04-07 DIAGNOSIS — I70245 Atherosclerosis of native arteries of left leg with ulceration of other part of foot: Secondary | ICD-10-CM | POA: Insufficient documentation

## 2023-04-07 DIAGNOSIS — I7025 Atherosclerosis of native arteries of other extremities with ulceration: Secondary | ICD-10-CM

## 2023-04-07 DIAGNOSIS — E785 Hyperlipidemia, unspecified: Secondary | ICD-10-CM | POA: Insufficient documentation

## 2023-04-07 DIAGNOSIS — I10 Essential (primary) hypertension: Secondary | ICD-10-CM | POA: Diagnosis not present

## 2023-04-07 DIAGNOSIS — T82856A Stenosis of peripheral vascular stent, initial encounter: Secondary | ICD-10-CM

## 2023-04-07 DIAGNOSIS — T82868A Thrombosis of vascular prosthetic devices, implants and grafts, initial encounter: Secondary | ICD-10-CM | POA: Diagnosis not present

## 2023-04-07 DIAGNOSIS — I70249 Atherosclerosis of native arteries of left leg with ulceration of unspecified site: Secondary | ICD-10-CM

## 2023-04-07 DIAGNOSIS — L97529 Non-pressure chronic ulcer of other part of left foot with unspecified severity: Secondary | ICD-10-CM | POA: Insufficient documentation

## 2023-04-07 DIAGNOSIS — Z95828 Presence of other vascular implants and grafts: Secondary | ICD-10-CM | POA: Insufficient documentation

## 2023-04-07 DIAGNOSIS — L97929 Non-pressure chronic ulcer of unspecified part of left lower leg with unspecified severity: Secondary | ICD-10-CM | POA: Diagnosis not present

## 2023-04-07 DIAGNOSIS — I70219 Atherosclerosis of native arteries of extremities with intermittent claudication, unspecified extremity: Secondary | ICD-10-CM

## 2023-04-07 DIAGNOSIS — Z8249 Family history of ischemic heart disease and other diseases of the circulatory system: Secondary | ICD-10-CM | POA: Insufficient documentation

## 2023-04-07 DIAGNOSIS — Z9889 Other specified postprocedural states: Secondary | ICD-10-CM | POA: Diagnosis not present

## 2023-04-07 HISTORY — PX: LOWER EXTREMITY ANGIOGRAPHY: CATH118251

## 2023-04-07 LAB — CREATININE, SERUM
Creatinine, Ser: 0.99 mg/dL (ref 0.44–1.00)
GFR, Estimated: 59 mL/min — ABNORMAL LOW (ref >60)

## 2023-04-07 LAB — BUN: BUN: 19 mg/dL (ref 8–23)

## 2023-04-07 SURGERY — LOWER EXTREMITY ANGIOGRAPHY
Anesthesia: Moderate Sedation | Laterality: Bilateral

## 2023-04-07 MED ORDER — MIDAZOLAM HCL 2 MG/2ML IJ SOLN
INTRAMUSCULAR | Status: DC | PRN
  Administered 2023-04-07 (×2): 1 mg via INTRAVENOUS

## 2023-04-07 MED ORDER — MIDAZOLAM HCL 2 MG/2ML IJ SOLN
INTRAMUSCULAR | Status: AC
  Filled 2023-04-07: qty 4

## 2023-04-07 MED ORDER — MIDAZOLAM HCL 2 MG/ML PO SYRP
8.0000 mg | ORAL_SOLUTION | Freq: Once | ORAL | Status: DC | PRN
Start: 2023-04-07 — End: 2023-04-07

## 2023-04-07 MED ORDER — HYDROMORPHONE HCL 1 MG/ML IJ SOLN: 1.0000 mg | Freq: Once | INTRAMUSCULAR | Status: DC | PRN

## 2023-04-07 MED ORDER — LABETALOL HCL 5 MG/ML IV SOLN: 10.0000 mg | INTRAVENOUS | Status: DC | PRN

## 2023-04-07 MED ORDER — IODIXANOL 320 MG/ML IV SOLN
INTRAVENOUS | Status: DC | PRN
  Administered 2023-04-07: 60 mL

## 2023-04-07 MED ORDER — ASPIRIN 81 MG PO TBEC: 81.0000 mg | DELAYED_RELEASE_TABLET | Freq: Every day | ORAL | Status: DC

## 2023-04-07 MED ORDER — HEPARIN (PORCINE) IN NACL 1000-0.9 UT/500ML-% IV SOLN
INTRAVENOUS | Status: DC | PRN
  Administered 2023-04-07: 1000 mL

## 2023-04-07 MED ORDER — DIPHENHYDRAMINE HCL 50 MG/ML IJ SOLN: 50.0000 mg | Freq: Once | INTRAMUSCULAR | Status: DC | PRN

## 2023-04-07 MED ORDER — HYDRALAZINE HCL 20 MG/ML IJ SOLN: 5.0000 mg | INTRAMUSCULAR | Status: DC | PRN

## 2023-04-07 MED ORDER — SODIUM CHLORIDE 0.9% FLUSH: 3.0000 mL | INTRAVENOUS | Status: DC | PRN

## 2023-04-07 MED ORDER — FAMOTIDINE 20 MG PO TABS: 40.0000 mg | ORAL_TABLET | Freq: Once | ORAL | Status: DC | PRN

## 2023-04-07 MED ORDER — LIDOCAINE-EPINEPHRINE (PF) 1 %-1:200000 IJ SOLN
INTRAMUSCULAR | Status: DC | PRN
  Administered 2023-04-07: 10 mL

## 2023-04-07 MED ORDER — METHYLPREDNISOLONE SODIUM SUCC 125 MG IJ SOLR: 125.0000 mg | Freq: Once | INTRAMUSCULAR | Status: DC | PRN

## 2023-04-07 MED ORDER — CEFAZOLIN SODIUM-DEXTROSE 2-4 GM/100ML-% IV SOLN
INTRAVENOUS | Status: AC
  Filled 2023-04-07: qty 100

## 2023-04-07 MED ORDER — ONDANSETRON HCL 4 MG/2ML IJ SOLN: 4.0000 mg | Freq: Four times a day (QID) | INTRAMUSCULAR | Status: DC | PRN

## 2023-04-07 MED ORDER — CEFAZOLIN SODIUM-DEXTROSE 2-4 GM/100ML-% IV SOLN
2.0000 g | INTRAVENOUS | Status: AC
  Administered 2023-04-07: 2 g via INTRAVENOUS

## 2023-04-07 MED ORDER — LABETALOL HCL 5 MG/ML IV SOLN
INTRAVENOUS | Status: DC | PRN
Start: 2023-04-07 — End: 2023-04-07
  Administered 2023-04-07: 20 mg via INTRAVENOUS

## 2023-04-07 MED ORDER — LABETALOL HCL 5 MG/ML IV SOLN
INTRAVENOUS | Status: AC
  Filled 2023-04-07: qty 4

## 2023-04-07 MED ORDER — SODIUM CHLORIDE 0.9 % IV SOLN: 250.0000 mL | INTRAVENOUS | Status: DC | PRN

## 2023-04-07 MED ORDER — SODIUM CHLORIDE 0.9 % IV SOLN: INTRAVENOUS | Status: DC

## 2023-04-07 MED ORDER — HEPARIN SODIUM (PORCINE) 1000 UNIT/ML IJ SOLN
INTRAMUSCULAR | Status: AC
  Filled 2023-04-07: qty 10

## 2023-04-07 MED ORDER — FENTANYL CITRATE (PF) 100 MCG/2ML IJ SOLN
INTRAMUSCULAR | Status: DC | PRN
  Administered 2023-04-07: 50 ug via INTRAVENOUS
  Administered 2023-04-07: 12.5 ug via INTRAVENOUS

## 2023-04-07 MED ORDER — FENTANYL CITRATE (PF) 100 MCG/2ML IJ SOLN
INTRAMUSCULAR | Status: AC
  Filled 2023-04-07: qty 2

## 2023-04-07 MED ORDER — HEPARIN SODIUM (PORCINE) 1000 UNIT/ML IJ SOLN
INTRAMUSCULAR | Status: DC | PRN
  Administered 2023-04-07: 5000 [IU] via INTRAVENOUS

## 2023-04-07 MED ORDER — ACETAMINOPHEN 325 MG PO TABS: 650.0000 mg | ORAL_TABLET | ORAL | Status: DC | PRN

## 2023-04-07 MED ORDER — SODIUM CHLORIDE 0.9% FLUSH: 3.0000 mL | Freq: Two times a day (BID) | INTRAVENOUS | Status: DC

## 2023-04-07 SURGICAL SUPPLY — 23 items
BALLN LUTONIX AV 8X60X75 (BALLOONS) ×1 IMPLANT
BALLN ULTRVRSE 7X60X75C (BALLOONS) ×1 IMPLANT
BALLN ULTRVRSE 8X60X75C (BALLOONS) ×1 IMPLANT
BALLOON LUTONIX AV 8X60X75 (BALLOONS) IMPLANT
BALLOON ULTRVRSE 7X60X75C (BALLOONS) IMPLANT
BALLOON ULTRVRSE 8X60X75C (BALLOONS) IMPLANT
CANISTER PENUMBRA ENGINE (MISCELLANEOUS) IMPLANT
CATH ANGIO 5F PIGTAIL 65CM (CATHETERS) IMPLANT
CATH BEACON 5 .035 40 KMP TP (CATHETERS) IMPLANT
CATH LIGHTNING BOLT 7 130 (CATHETERS) IMPLANT
COVER PROBE ULTRASOUND 5X96 (MISCELLANEOUS) IMPLANT
DEVICE PRESTO INFLATION (MISCELLANEOUS) IMPLANT
DEVICE STARCLOSE SE CLOSURE (Vascular Products) IMPLANT
GLIDESHEATH SLENDER 7FR .021G (SHEATH) IMPLANT
GLIDEWIRE ADV .035X180CM (WIRE) IMPLANT
PACK ANGIOGRAPHY (CUSTOM PROCEDURE TRAY) ×1 IMPLANT
SHEATH BRITE TIP 5FRX11 (SHEATH) IMPLANT
STENT LIFESTAR 9X40 (Permanent Stent) IMPLANT
SYR MEDRAD MARK 7 150ML (SYRINGE) IMPLANT
TUBING CONTRAST HIGH PRESS 72 (TUBING) IMPLANT
WIRE G 018X200 V18 (WIRE) IMPLANT
WIRE J 3MM .035X145CM (WIRE) IMPLANT
WIRE SUPRACORE 190CM (WIRE) IMPLANT

## 2023-04-07 NOTE — Discharge Instructions (Signed)

## 2023-04-07 NOTE — Progress Notes (Signed)
 Alexas helped me with cleaning up patient and getting patient dressed prior to discharge. Alexas helped me with getting patient into wheelchair, and helping her son Jonny Ruiz get patient safely into vehicle.

## 2023-04-07 NOTE — Interval H&P Note (Signed)
 History and Physical Interval Note:  04/07/2023 9:11 AM  Erin Trujillo  has presented today for surgery, with the diagnosis of Bilateral Iliac Stent   ASO w claudication.  The various methods of treatment have been discussed with the patient and family. After consideration of risks, benefits and other options for treatment, the patient has consented to  Procedure(s): Lower Extremity Angiography (Bilateral) as a surgical intervention.  The patient's history has been reviewed, patient examined, no change in status, stable for surgery.  I have reviewed the patient's chart and labs.  Questions were answered to the patient's satisfaction.     Festus Barren

## 2023-04-07 NOTE — Op Note (Signed)
 [facility]  Percutaneous Study/Intervention Procedural Note   Date of Surgery: 04/07/2023  Surgeon(s):Deloyce Walthers    Assistants:none  Pre-operative Diagnosis: PAD with ulceration LLE  Post-operative diagnosis:  Same  Procedure(s) Performed:             1.  Ultrasound guidance for vascular access bilateral femoral arteries             2.  Catheter placement into aorta from bilateral femoral approaches             3.  Aortogram and bilateral iliofemoral angiograms             4.  Percutaneous transluminal angioplasty of right common iliac artery with 7 mm diameter by 6 cm length angioplasty balloon             5.  Mechanical thrombectomy of the left common and external iliac arteries with the penumbra 7 bolt device for thrombosis of the left iliac system  6.  Stent placement to the left iliac bifurcation and left proximal external iliac artery with 9 mm diameter by 4 cm length life stent             7.  StarClose closure device bilateral femoral arteries  EBL: 25 cc  Contrast: 60 cc  Fluoro Time: 4.2 minutes  Moderate Conscious Sedation Time: approximately 45 minutes using 2 mg of Versed and 62.5 mcg of Fentanyl              Indications:  Patient is a 78 y.o.female with ulceration on the left lower extremity with a history of peripheral arterial disease and previous iliac stent placement several years ago. The patient has noninvasive study showing thrombosis/occlusion of her left iliac stents with a reduction in the ABI and monophasic flow in the left lower extremity. The patient is brought in for angiography for further evaluation and potential treatment.  Due to the limb threatening nature of the situation, angiogram was performed for attempted limb salvage. The patient is aware that if the procedure fails, amputation would be expected.  The patient also understands that even with successful revascularization, amputation may still be required due to the severity  of the situation.  But he Risks and benefits are discussed and informed consent is obtained.   Procedure:  The patient was identified and appropriate procedural time out was performed.  The patient was then placed supine on the table and prepped and draped in the usual sterile fashion. Moderate conscious sedation was administered during a face to face encounter with the patient throughout the procedure with my supervision of the RN administering medicines and monitoring the patient's vital signs, pulse oximetry, telemetry and mental status throughout from the start of the procedure until the patient was taken to the recovery room. Ultrasound was used to evaluate the right common femoral artery.  It was patent .  A digital ultrasound image was acquired.  A Seldinger needle was used to access the right common femoral artery under direct ultrasound guidance and a permanent image was performed.  A 0.035 J wire was advanced without resistance and a 5Fr sheath was placed.  Pigtail catheter was placed into the aorta and an AP aortogram was performed. This demonstrated normal renal arteries and normal aorta.  The left iliac stent was occluded.  The right iliac stent appeared to have flow with what appeared to be mild disease in the proximal segment.  I then pulled the pigtail catheter down to the aortic  bifurcation to help opacify the iliac arteries and femoral arteries better.  The right iliac stent and external iliac artery did not have hemodynamically significant stenosis but there was some mild narrowing at the top of the right iliac stent.  This would need to be balloon to protect the right side during intervention on the left side.  The left common iliac stent was occluded and there was reconstitution through the left common femoral artery hypogastric artery.  The left common femoral artery was patent.  It was felt that it was in the patient's best interest to proceed with intervention after these images to avoid a  second procedure and a larger amount of contrast and fluoroscopy based off of the findings from the initial angiogram.  The left femoral artery was visualized with ultrasound and it was found to be patent.  It was then accessed under direct ultrasound guidance without difficulty with a Seldinger needle.  A wire and 7 French slender sheath was then placed on the left side.  The patient was systemically heparinized.  I exchanged for a supra core wire on the right and then used a 7 mm diameter by 6 cm length angioplasty balloon and inflated this in the proximal right common iliac artery to protect the right iliac artery while we performed intervention on the left.  The penumbra 7 bolt device was then brought onto the field.  After crossing the occlusion without difficulty with an advantage wire and confirming intraluminal flow with a Kumpe catheter in the aorta, then exchanged for a V18 wire.  2 passes were made with the penumbra 7 bolt device in the left iliac system performing mechanical thrombectomy for the thrombosed stents.  After these passes, a large amount of thrombus was removed and there was now flow through the stents.  This did uncover stenosis at the iliac bifurcation on the left that may have been the cause of the failure and thrombosis of the left common iliac stent.  I elected to treat this with an 9 mm diameter by 4 cm length life stent across the iliac bifurcation and into the proximal left external iliac artery.  This was postdilated with an 8 mm diameter Lutonix drug-coated angioplasty balloon with excellent angiographic completion result and no significant residual stenosis in the left common or external iliac arteries.  The balloon was deflated and the right common iliac artery and completion imaging showed no significant stenosis with brisk flow through the right common and external iliac arteries.  I elected to terminate the procedure. The sheath was removed and StarClose closure device was  deployed in the left femoral artery with excellent hemostatic result.  Similarly, StarClose closure device was deployed in the right femoral artery with an excellent hemostatic result.  The patient was taken to the recovery room in stable condition having tolerated the procedure well.  Findings:               Aortogram/iliofemoral angiogram:  This demonstrated normal renal arteries and normal aorta.  The left iliac stent was occluded.  The right iliac stent appeared to have flow with what appeared to be mild disease in the proximal segment.  I then pulled the pigtail catheter down to the aortic bifurcation to help opacify the iliac arteries and femoral arteries better.  The right iliac stent and external iliac artery did not have hemodynamically significant stenosis but there was some mild narrowing at the top of the right iliac stent.  This would need to be balloon  to protect the right side during intervention on the left side.  The left common iliac stent was occluded and there was reconstitution through the left common femoral artery hypogastric artery.  The left common femoral artery was patent.                Disposition: Patient was taken to the recovery room in stable condition having tolerated the procedure well.  Complications: None  Festus Barren 04/07/2023 11:29 AM   This note was created with Dragon Medical transcription system. Any errors in dictation are purely unintentional.

## 2023-04-08 ENCOUNTER — Encounter: Payer: Self-pay | Admitting: Vascular Surgery

## 2023-05-05 ENCOUNTER — Other Ambulatory Visit (INDEPENDENT_AMBULATORY_CARE_PROVIDER_SITE_OTHER): Payer: Self-pay | Admitting: Nurse Practitioner

## 2023-05-05 DIAGNOSIS — Z9889 Other specified postprocedural states: Secondary | ICD-10-CM

## 2023-05-06 ENCOUNTER — Encounter (INDEPENDENT_AMBULATORY_CARE_PROVIDER_SITE_OTHER): Payer: Medicare Other

## 2023-05-09 ENCOUNTER — Ambulatory Visit (INDEPENDENT_AMBULATORY_CARE_PROVIDER_SITE_OTHER): Payer: Medicare Other | Admitting: Nurse Practitioner

## 2023-05-10 ENCOUNTER — Ambulatory Visit (INDEPENDENT_AMBULATORY_CARE_PROVIDER_SITE_OTHER)

## 2023-05-10 ENCOUNTER — Ambulatory Visit (INDEPENDENT_AMBULATORY_CARE_PROVIDER_SITE_OTHER): Admitting: Vascular Surgery

## 2023-05-10 ENCOUNTER — Encounter (INDEPENDENT_AMBULATORY_CARE_PROVIDER_SITE_OTHER): Payer: Self-pay | Admitting: Vascular Surgery

## 2023-05-10 VITALS — BP 143/101 | HR 105 | Resp 16

## 2023-05-10 DIAGNOSIS — I10 Essential (primary) hypertension: Secondary | ICD-10-CM

## 2023-05-10 DIAGNOSIS — Z9889 Other specified postprocedural states: Secondary | ICD-10-CM

## 2023-05-10 DIAGNOSIS — I739 Peripheral vascular disease, unspecified: Secondary | ICD-10-CM

## 2023-05-10 DIAGNOSIS — I7025 Atherosclerosis of native arteries of other extremities with ulceration: Secondary | ICD-10-CM

## 2023-05-10 DIAGNOSIS — E785 Hyperlipidemia, unspecified: Secondary | ICD-10-CM | POA: Diagnosis not present

## 2023-05-10 DIAGNOSIS — I6523 Occlusion and stenosis of bilateral carotid arteries: Secondary | ICD-10-CM | POA: Diagnosis not present

## 2023-05-10 NOTE — Progress Notes (Unsigned)
 MRN : [id number]  Erin Trujillo is a 78 y.o. (10-Nov-1945) female who presents with chief complaint of  Chief Complaint  Patient presents with   Follow-up    [facility] 4 week with ABI  .  History of Present Illness: Patient returns today in follow up of her PAD. ***  Current Outpatient Medications  Medication Sig Dispense Refill   acetaminophen (TYLENOL) 500 MG tablet Take 500 mg by mouth.     ELIQUIS 5 MG TABS tablet 5 mg 2 (two) times daily.   4   levalbuterol (XOPENEX HFA) 45 MCG/ACT inhaler Inhale into the lungs.     metoprolol succinate (TOPROL-XL) 100 MG 24 hr tablet Take 100 mg by mouth daily.     Multiple Vitamin (MULTIVITAMIN) capsule Take 1 capsule by mouth daily.     amLODipine (NORVASC) 2.5 MG tablet Take by mouth.     aspirin EC 81 MG tablet Take 1 tablet (81 mg total) by mouth daily. (Patient not taking: Reported on 04/07/2023) 150 tablet 2   ipratropium (ATROVENT) 0.06 % nasal spray Place into the nose.     simvastatin (ZOCOR) 40 MG tablet Take 40 mg by mouth daily at 6 PM.  (Patient not taking: Reported on 04/07/2023)  0   No current facility-administered medications for this visit.    Past Medical History:  Diagnosis Date   Arthritis    COPD (chronic obstructive pulmonary disease) (HCC)    Hyperlipidemia    Hypertension    Squamous cell skin cancer     Past Surgical History:  Procedure Laterality Date   ABDOMINAL HYSTERECTOMY     APPENDECTOMY     CHOLECYSTECTOMY     LOWER EXTREMITY ANGIOGRAPHY Right 08/09/2019   Procedure: LOWER EXTREMITY ANGIOGRAPHY;  Surgeon: Annice Needy, MD;  Location: [facility] INVASIVE CV LAB;  Service: Cardiovascular;  Laterality: Right;   LOWER EXTREMITY ANGIOGRAPHY Bilateral 04/07/2023   Procedure: Lower Extremity Angiography;  Surgeon: Annice Needy, MD;  Location: [facility] INVASIVE CV LAB;  Service: Cardiovascular;  Laterality: Bilateral;   spine cyst removal     TONSILLECTOMY       Social History   Tobacco Use   Smoking status:  Former    Current packs/day: 0.00    Average packs/day: 0.8 packs/day for 48.0 years (36.0 ttl pk-yrs)    Types: Cigarettes    Start date: 06/08/1972    Quit date: 06/08/2020    Years since quitting: 2.9   Smokeless tobacco: Former  Substance Use Topics   Alcohol use: Yes    Alcohol/week: 7.0 standard drinks of alcohol    Types: 7 Standard drinks or equivalent per week   Drug use: Never      Family History  Problem Relation Age of Onset   Arthritis Mother    Anxiety disorder Mother    Osteoporosis Mother    Hypertension Father    Stroke Father    Cancer Son    Breast cancer Neg Hx      Allergies  Allergen Reactions   Ciprofloxacin Nausea Only     REVIEW OF SYSTEMS (Negative unless checked)   Constitutional: [] Weight loss  [] Fever  [] Chills Cardiac: [] Chest pain   [] Chest pressure   [] Palpitations   [] Shortness of breath when laying flat   [x] Shortness of breath at rest   [x] Shortness of breath with exertion. Vascular:  [x] Pain in legs with walking   [] Pain in legs at rest   [] Pain in legs when laying flat   []   Claudication   [] Pain in feet when walking  [] Pain in feet at rest  [] Pain in feet when laying flat   [] History of DVT   [] Phlebitis   [x] Swelling in legs   [] Varicose veins   [] Non-healing ulcers Pulmonary:   [] Uses home oxygen   [] Productive cough   [] Hemoptysis   [] Wheeze  [x] COPD   [] Asthma Neurologic:  [] Dizziness  [] Blackouts   [] Seizures   [] History of stroke   [] History of TIA  [] Aphasia   [] Temporary blindness   [] Dysphagia   [] Weakness or numbness in arms   [x] Weakness or numbness in legs Musculoskeletal:  [x] Arthritis   [] Joint swelling   [] Joint pain   [] Low back pain Hematologic:  [x] Easy bruising  [] Easy bleeding   [] Hypercoagulable state   [] Anemic   Gastrointestinal:  [] Blood in stool   [] Vomiting blood  [] Gastroesophageal reflux/heartburn   [] Abdominal pain Genitourinary:  [] Chronic kidney disease   [] Difficult urination  [] Frequent urination  [] Burning  with urination   [] Hematuria Skin:  [] Rashes   [] Ulcers   [] Wounds Psychological:  [] History of anxiety   []  History of major depression.  Physical Examination  BP (!) 143/101   Pulse (!) 105   Resp 16  Gen:  WD/WN, NAD Head: [place]/AT, No temporalis wasting. Ear/Nose/Throat: Hearing grossly intact, nares w/o erythema or drainage Eyes: Conjunctiva clear. Sclera non-icteric Neck: Supple.  Trachea midline Pulmonary:  Good air movement, no use of accessory muscles.  Cardiac: tachycardic and irregular Vascular:  Vessel Right Left  Radial Palpable Palpable                          PT Not Palpable Not Palpable  DP 1+ Palpable 1+ Palpable   Gastrointestinal: soft, non-tender/non-distended. No guarding/reflex.  Musculoskeletal: M/S 5/5 throughout.  No deformity or atrophy. Purplish discoloration of both feet.  1+ BLE edema. Neurologic: Sensation grossly intact in extremities.  Symmetrical.  Speech is fluent.  Psychiatric: Judgment intact, Mood & affect appropriate for pt's clinical situation. Dermatologic: No rashes or ulcers noted.  No cellulitis or open wounds.      Labs Recent Results (from the past 2160 hours)  Basic metabolic panel     Status: Abnormal   Collection Time: 02/19/23  2:11 PM  Result Value Ref Range   Sodium 135 135 - 145 mmol/L   Potassium 3.3 (L) 3.5 - 5.1 mmol/L   Chloride 95 (L) 98 - 111 mmol/L   CO2 29 22 - 32 mmol/L   Glucose, Bld 105 (H) 70 - 99 mg/dL    Comment: Glucose reference range applies only to samples taken after fasting for at least 8 hours.   BUN 19 8 - 23 mg/dL   Creatinine, Ser 4.09 0.44 - 1.00 mg/dL   Calcium 8.9 8.9 - 81.1 mg/dL   GFR, Estimated >91 >47 mL/min    Comment: (NOTE) Calculated using the CKD-EPI Creatinine Equation (2021)    Anion gap 11 5 - 15    Comment: Performed at [facility], [address] [place], [place] [address]  CBC     Status: Abnormal   Collection Time: 02/19/23  2:11 PM  Result Value Ref  Range   WBC 7.9 4.0 - 10.5 K/uL   RBC 4.21 3.87 - 5.11 MIL/uL   Hemoglobin 13.5 12.0 - 15.0 g/dL   HCT 21.3 08.6 - 57.8 %   MCV 101.0 (H) 80.0 - 100.0 fL   MCH 32.1 26.0 - 34.0 pg  MCHC 31.8 30.0 - 36.0 g/dL   RDW 65.7 (H) 84.6 - 96.2 %   Platelets 268 150 - 400 K/uL   nRBC 0.0 0.0 - 0.2 %    Comment: Performed at [facility], [address] [place], [place] [address]  VAS [place] ABI WITH/WO TBI     Status: None   Collection Time: 03/14/23  3:53 PM  Result Value Ref Range   Right ABI 1.03    Left ABI .83   BUN     Status: None   Collection Time: 04/07/23  9:45 AM  Result Value Ref Range   BUN 19 8 - 23 mg/dL    Comment: Performed at [facility], [address] [place], [place] [address]  Creatinine, serum     Status: Abnormal   Collection Time: 04/07/23  9:45 AM  Result Value Ref Range   Creatinine, Ser 0.99 0.44 - 1.00 mg/dL   GFR, Estimated 59 (L) >60 mL/min    Comment: (NOTE) Calculated using the CKD-EPI Creatinine Equation (2021) Performed at [facility], [address] [place], [place] [address]   VAS [place] ABI WITH/WO TBI     Status: None   Collection Time: 05/10/23  9:38 AM  Result Value Ref Range   Right ABI 0.82    Left ABI 0.72     Radiology VAS [place] ABI WITH/WO TBI Result Date: 05/11/2023  LOWER EXTREMITY DOPPLER STUDY Patient Name:  LAYKIN RAINONE  Date of Exam:   05/10/2023 Medical Rec #: [id number]         Accession #:    [id number] Date of Birth: [birth year]         Patient Gender: F Patient Age:   85 years Exam Location:  [facility] Procedure:      VAS [place] ABI WITH/WO TBI Referring Phys: Sheppard Plumber --------------------------------------------------------------------------------  Indications: Rest pain, and peripheral artery disease. Decreased pulses felt at              the level of the Ankle. High Risk Factors: Hypertension, past history of smoking. Other Factors: Patient reports discoloration has improved, however pain  and                weakness persist.  Vascular Interventions: Bilat CIA stents. Limitations: Today's exam was limited due to Arrhythmia. Performing Technologist: Hardie Lora RVT  Examination Guidelines: A complete evaluation includes at minimum, Doppler waveform signals and systolic blood pressure reading at the level of bilateral brachial, anterior tibial, and posterior tibial arteries, when vessel segments are accessible. Bilateral testing is considered an integral part of a complete examination. Photoelectric Plethysmograph (PPG) waveforms and toe systolic pressure readings are included as required and additional duplex testing as needed. Limited examinations for reoccurring indications may be performed as noted.  ABI Findings: +---------+------------------+-----+----------+--------+ Right    Rt Pressure (mmHg)IndexWaveform  Comment  +---------+------------------+-----+----------+--------+ Brachial 139                                       +---------+------------------+-----+----------+--------+ PTA      125               0.82 monophasic         +---------+------------------+-----+----------+--------+ DP       112               0.73 monophasic         +---------+------------------+-----+----------+--------+  Great Toe0                 0.00                    +---------+------------------+-----+----------+--------+ +---------+------------------+-----+----------+-------+ Left     Lt Pressure (mmHg)IndexWaveform  Comment +---------+------------------+-----+----------+-------+ Brachial 153                                      +---------+------------------+-----+----------+-------+ PTA      110               0.72 monophasic        +---------+------------------+-----+----------+-------+ DP       107               0.70 monophasic        +---------+------------------+-----+----------+-------+ Great Toe0                 0.00                    +---------+------------------+-----+----------+-------+ +-------+-----------+-----------+------------+------------+ ABI/TBIToday's ABIToday's TBIPrevious ABIPrevious TBI +-------+-----------+-----------+------------+------------+ Right  0.82       0.00       1.03        0.61         +-------+-----------+-----------+------------+------------+ Left   0.72       0.00       0.83        0.72         +-------+-----------+-----------+------------+------------+  No ppg signal detected in great toes bilaterally. Bilateral ABIs appear decreased compared to prior study on 03/18/2023.  Summary: Right: Resting right ankle-brachial index indicates mild right lower extremity arterial disease. The right toe-brachial index is abnormal. Limited imaging showed patent iliac artery and triphasic distal SFA Doppler waveform. Left: Resting left ankle-brachial index indicates moderate left lower extremity arterial disease. The left toe-brachial index is abnormal. Limited imaging showed patent iliac artery and triphasic distal SFA Doppler waveform. *See table(s) above for measurements and observations.  Electronically signed by Festus Barren MD on 05/11/2023 at 7:34:43 AM.    Final     Assessment/Plan  No problem-specific Assessment & Plan notes found for this encounter.  Carotid atherosclerosis, bilateral Recently checked. 1 to 39% bilaterally.  This can be checked every year or 2.   Essential hypertension blood pressure control important in reducing the progression of atherosclerotic disease. On appropriate oral medications.     Hyperlipidemia lipid control important in reducing the progression of atherosclerotic disease. Continue statin therapy  Festus Barren, MD  05/12/2023 9:24 AM    This note was created with Dragon medical transcription system.  Any errors from dictation are purely unintentional

## 2023-05-11 ENCOUNTER — Telehealth (INDEPENDENT_AMBULATORY_CARE_PROVIDER_SITE_OTHER): Payer: Self-pay

## 2023-05-11 LAB — VAS US ABI WITH/WO TBI
Left ABI: 0.72
Right ABI: 0.82

## 2023-05-11 NOTE — Telephone Encounter (Signed)
 Erin Trujillo called stating that he forgot to get the consult note filled out to take back to [name]. I suggested that we fax him or them her visit notes or he can come and pick them up. He stated he will call back with a fax number.

## 2023-05-13 NOTE — Assessment & Plan Note (Signed)
 Her ABIs are 0.82 on the right and 0.72 on the left but this was limited by significant arrhythmia and she was monophasic both lower extremities possibly due to poor cardiac output. At this point, I think following up with her cardiologist would be most appropriate.  I will plan a short interval follow-up of 2 to 3 months.  I would recommend continuing Eliquis and Zocor.

## 2023-05-15 ENCOUNTER — Emergency Department

## 2023-05-15 ENCOUNTER — Other Ambulatory Visit: Payer: Self-pay

## 2023-05-15 ENCOUNTER — Emergency Department
Admission: EM | Admit: 2023-05-15 | Discharge: 2023-05-15 | Disposition: A | Discharge status: Hospice | Attending: Emergency Medicine | Admitting: Emergency Medicine

## 2023-05-15 DIAGNOSIS — E872 Acidosis, unspecified: Secondary | ICD-10-CM | POA: Diagnosis not present

## 2023-05-15 DIAGNOSIS — Z515 Encounter for palliative care: Secondary | ICD-10-CM | POA: Diagnosis not present

## 2023-05-15 DIAGNOSIS — R791 Abnormal coagulation profile: Secondary | ICD-10-CM | POA: Insufficient documentation

## 2023-05-15 DIAGNOSIS — A419 Sepsis, unspecified organism: Secondary | ICD-10-CM | POA: Insufficient documentation

## 2023-05-15 DIAGNOSIS — N390 Urinary tract infection, site not specified: Secondary | ICD-10-CM | POA: Insufficient documentation

## 2023-05-15 DIAGNOSIS — K7200 Acute and subacute hepatic failure without coma: Secondary | ICD-10-CM | POA: Insufficient documentation

## 2023-05-15 DIAGNOSIS — J9601 Acute respiratory failure with hypoxia: Secondary | ICD-10-CM | POA: Insufficient documentation

## 2023-05-15 DIAGNOSIS — J189 Pneumonia, unspecified organism: Secondary | ICD-10-CM | POA: Insufficient documentation

## 2023-05-15 DIAGNOSIS — R0602 Shortness of breath: Secondary | ICD-10-CM | POA: Diagnosis present

## 2023-05-15 DIAGNOSIS — N179 Acute kidney failure, unspecified: Secondary | ICD-10-CM | POA: Insufficient documentation

## 2023-05-15 LAB — CBC WITH DIFFERENTIAL/PLATELET
Abs Immature Granulocytes: 0.1 10*3/uL — ABNORMAL HIGH (ref 0.00–0.07)
Basophils Absolute: 0 10*3/uL (ref 0.0–0.1)
Basophils Relative: 0 %
Eosinophils Absolute: 0 10*3/uL (ref 0.0–0.5)
Eosinophils Relative: 0 %
HCT: 54.5 % — ABNORMAL HIGH (ref 36.0–46.0)
Hemoglobin: 16.1 g/dL — ABNORMAL HIGH (ref 12.0–15.0)
Immature Granulocytes: 1 %
Lymphocytes Relative: 10 %
Lymphs Abs: 0.7 10*3/uL (ref 0.7–4.0)
MCH: 31.6 pg (ref 26.0–34.0)
MCHC: 29.5 g/dL — ABNORMAL LOW (ref 30.0–36.0)
MCV: 106.9 fL — ABNORMAL HIGH (ref 80.0–100.0)
Monocytes Absolute: 0.4 10*3/uL (ref 0.1–1.0)
Monocytes Relative: 6 %
Neutro Abs: 5.7 10*3/uL (ref 1.7–7.7)
Neutrophils Relative %: 83 %
Platelets: 258 10*3/uL (ref 150–400)
RBC: 5.1 MIL/uL (ref 3.87–5.11)
RDW: 18.2 % — ABNORMAL HIGH (ref 11.5–15.5)
WBC: 6.9 10*3/uL (ref 4.0–10.5)
nRBC: 1.2 % — ABNORMAL HIGH (ref 0.0–0.2)

## 2023-05-15 LAB — COMPREHENSIVE METABOLIC PANEL WITH GFR
ALT: 517 U/L — ABNORMAL HIGH (ref 0–44)
AST: 744 U/L — ABNORMAL HIGH (ref 15–41)
Albumin: 2.8 g/dL — ABNORMAL LOW (ref 3.5–5.0)
Alkaline Phosphatase: 187 U/L — ABNORMAL HIGH (ref 38–126)
Anion gap: 22 — ABNORMAL HIGH (ref 5–15)
BUN: 41 mg/dL — ABNORMAL HIGH (ref 8–23)
CO2: 18 mmol/L — ABNORMAL LOW (ref 22–32)
Calcium: 9.2 mg/dL (ref 8.9–10.3)
Chloride: 104 mmol/L (ref 98–111)
Creatinine, Ser: 2.93 mg/dL — ABNORMAL HIGH (ref 0.44–1.00)
GFR, Estimated: 16 mL/min — ABNORMAL LOW (ref >60)
Glucose, Bld: 49 mg/dL — ABNORMAL LOW (ref 70–99)
Potassium: 4.8 mmol/L (ref 3.5–5.1)
Sodium: 144 mmol/L (ref 135–145)
Total Bilirubin: 1.7 mg/dL — ABNORMAL HIGH (ref 0.0–1.2)
Total Protein: 7.1 g/dL (ref 6.5–8.1)

## 2023-05-15 LAB — RESP PANEL BY RT-PCR (RSV, FLU A&B, COVID)  RVPGX2
Influenza A by PCR: NEGATIVE
Influenza B by PCR: NEGATIVE
Resp Syncytial Virus by PCR: NEGATIVE
SARS Coronavirus 2 by RT PCR: NEGATIVE

## 2023-05-15 LAB — BLOOD GAS, VENOUS
Acid-base deficit: 9.6 mmol/L — ABNORMAL HIGH (ref 0.0–2.0)
Bicarbonate: 18.7 mmol/L — ABNORMAL LOW (ref 20.0–28.0)
O2 Content: 12 L/min
O2 Saturation: 32.4 %
Patient temperature: 36.9
pCO2, Ven: 49 mmHg (ref 44–60)
pH, Ven: 7.19 — CL (ref 7.25–7.43)

## 2023-05-15 LAB — URINALYSIS, W/ REFLEX TO CULTURE (INFECTION SUSPECTED)
Bilirubin Urine: NEGATIVE
Glucose, UA: NEGATIVE mg/dL
Ketones, ur: NEGATIVE mg/dL
Leukocytes,Ua: NEGATIVE
Nitrite: NEGATIVE
Protein, ur: >=300 mg/dL — AB
Specific Gravity, Urine: 1.02 (ref 1.005–1.030)
pH: 5 (ref 5.0–8.0)

## 2023-05-15 LAB — LACTIC ACID, PLASMA: Lactic Acid, Venous: 8.8 mmol/L (ref 0.5–1.9)

## 2023-05-15 LAB — PROTIME-INR
INR: 7 (ref 0.8–1.2)
Prothrombin Time: 60.3 s — ABNORMAL HIGH (ref 11.4–15.2)

## 2023-05-15 LAB — BRAIN NATRIURETIC PEPTIDE: B Natriuretic Peptide: 1818.8 pg/mL — ABNORMAL HIGH (ref 0.0–100.0)

## 2023-05-15 MED ORDER — LACTATED RINGERS IV BOLUS (SEPSIS)
1000.0000 mL | Freq: Once | INTRAVENOUS | Status: AC
  Administered 2023-05-15: 1000 mL via INTRAVENOUS

## 2023-05-15 NOTE — ED Notes (Signed)
 This RN received a phone call from Hospice Triage RN, Aundra Millet. Megan had spoken with the son who is at bedside about possibly transferring pt to the hospice inpatient unit in [place]. This RN gave an update on labs and end of life care plan and told Aundra Millet I would pass this info along to the RN who would take over at day shift and admitting provider.   The phone number for the inpatient unit in [place] is:  [phone]

## 2023-05-15 NOTE — ED Notes (Signed)
 This RN spoke with Erin Trujillo and Misty Stanley at [facility]. Per Erin Trujillo patient has been accepted at [facility], call report to [phone], ED to arrange transport. Address is [address] [place]. EDP Magda Kiel, MD made aware.

## 2023-05-15 NOTE — Discharge Instructions (Signed)
 After extensive discussions with [name] son and healthcare power of attorney, the decision was made to make Erin Trujillo DNR-comfort care only.  She is being discharged directly to hospice as per her previously expressed wishes in the wishes of her family.

## 2023-05-15 NOTE — ED Provider Notes (Signed)
 [facility] Provider Note    Event Date/Time   First MD Initiated Contact with Patient 05/15/23 [phone]     (approximate)   History   Shortness of Breath  Level 5 caveat:  history/ROS limited by acute/critical illness  HPI Erin Trujillo is a 78 y.o. female who arrives by EMS from [facility] for evaluation of shortness of breath and decreased level of responsiveness.  Her oxygen saturation was apparently about 74% on 3 L by EMS and 83% on 6 L, although her fingers and hands are cold and the waveform is not consistent.  Patient is awake and will follow simple commands but is not communicative.  She is looking around with wide eyes and making eye contact with [place] but not otherwise responding.     Physical Exam   ED Triage Vitals  Encounter Vitals Group     BP 05/15/23 0156 92/77     Systolic BP Percentile --      Diastolic BP Percentile --      Pulse Rate 05/15/23 0156 (!) 113     Resp 05/15/23 0156 (!) 30     Temp 05/15/23 0203 98.4 F (36.9 C)     Temp Source 05/15/23 0203 Rectal     SpO2 05/15/23 0156 (!) 80 %     Weight 05/15/23 0201 84.5 kg (186 lb 4.8 oz)     Height --      Head Circumference --      Peak Flow --      Pain Score 05/15/23 0149 0     Pain Loc --      Pain Education --      Exclude from Growth Chart --       Most recent vital signs: Vitals:   05/15/23 0630 05/15/23 0649  BP: (!) 69/41   Pulse: (!) 153   Resp: (!) 31   Temp:  98.3 F (36.8 C)  SpO2: 100%     General: Awake, looks around spontaneously with a surprised expression and responding to simple commands, but not speaking with [place]. CV:  Poor peripheral perfusion, cool and mottled extremities.  Tachycardia in the 130s-140s. Resp:  Tachypnea but lungs are clear to auscultation. no accessory muscle usage nor intercostal retractions.   Abd:  No distention. Soft, No tenderness to palpation of the abdomen.   ED Results / Procedures / Treatments   Labs (all  labs ordered are listed, but only abnormal results are displayed) Labs Reviewed  LACTIC ACID, PLASMA - Abnormal; Notable for the following components:      Result Value   Lactic Acid, Venous 8.8 (*)    All other components within normal limits  COMPREHENSIVE METABOLIC PANEL WITH GFR - Abnormal; Notable for the following components:   CO2 18 (*)    Glucose, Bld 49 (*)    BUN 41 (*)    Creatinine, Ser 2.93 (*)    Albumin 2.8 (*)    AST 744 (*)    ALT 517 (*)    Alkaline Phosphatase 187 (*)    Total Bilirubin 1.7 (*)    GFR, Estimated 16 (*)    Anion gap 22 (*)    All other components within normal limits  CBC WITH DIFFERENTIAL/PLATELET - Abnormal; Notable for the following components:   Hemoglobin 16.1 (*)    HCT 54.5 (*)    MCV 106.9 (*)    MCHC 29.5 (*)    RDW 18.2 (*)    nRBC  1.2 (*)    Abs Immature Granulocytes 0.10 (*)    All other components within normal limits  BRAIN NATRIURETIC PEPTIDE - Abnormal; Notable for the following components:   B Natriuretic Peptide 1,818.8 (*)    All other components within normal limits  BLOOD GAS, VENOUS - Abnormal; Notable for the following components:   pH, Ven 7.19 (*)    Bicarbonate 18.7 (*)    Acid-base deficit 9.6 (*)    All other components within normal limits  PROTIME-INR - Abnormal; Notable for the following components:   Prothrombin Time 60.3 (*)    INR 7.0 (*)    All other components within normal limits  URINALYSIS, W/ REFLEX TO CULTURE (INFECTION SUSPECTED) - Abnormal; Notable for the following components:   Color, Urine AMBER (*)    APPearance CLOUDY (*)    Hgb urine dipstick SMALL (*)    Protein, ur >=300 (*)    Bacteria, UA MANY (*)    All other components within normal limits  CULTURE, BLOOD (ROUTINE X 2)  CULTURE, BLOOD (ROUTINE X 2)  RESP PANEL BY RT-PCR (RSV, FLU A&B, COVID)  [id number]     EKG  ED ECG REPORT I, Loleta Rose, the attending physician, personally viewed and interpreted this ECG.  Date:  05/15/2023 EKG Time: 1:56 Rate: 117 Rhythm: a-fib w/ RVR QRS Axis: normal Intervals: abnormal due to a-fib ST/T Wave abnormalities: Non-specific ST segment / T-wave changes, but no clear evidence of acute ischemia. Narrative Interpretation: no definitive evidence of acute ischemia; does not meet STEMI criteria.    RADIOLOGY See hospital course for details   PROCEDURES:  Critical Care performed: Yes, see critical care procedure note(s)  .1-3 Lead EKG Interpretation  Performed by: Loleta Rose, MD Authorized by: Loleta Rose, MD     Interpretation: abnormal     ECG rate:  143   ECG rate assessment: tachycardic     Rhythm: atrial fibrillation     Ectopy: none     Conduction: normal   .Critical Care  Performed by: Loleta Rose, MD Authorized by: Loleta Rose, MD   Critical care provider statement:    Critical care time (minutes):  45   Critical care time was exclusive of:  Separately billable procedures and treating other patients   Critical care was necessary to treat or prevent imminent or life-threatening deterioration of the following conditions:  Shock   Critical care was time spent personally by me on the following activities:  Development of treatment plan with patient or surrogate, evaluation of patient's response to treatment, examination of patient, obtaining history from patient or surrogate, ordering and performing treatments and interventions, ordering and review of laboratory studies, ordering and review of radiographic studies, pulse oximetry, re-evaluation of patient's condition and review of old charts     IMPRESSION / MDM / ASSESSMENT AND PLAN / ED COURSE  I reviewed the triage vital signs and the nursing notes.                              Differential diagnosis includes, but is not limited to, sepsis/septic shock, CVA, intracranial bleed, pneumonia, UTI, intra-abdominal infection, renal failure  Patient's presentation is most consistent with acute  presentation with potential threat to life or bodily function.  Labs/studies ordered: I ordered standard sepsis labs/studies including the following: respiratory viral panel PCR swab, blood cultures x2, pro time-INR, CMP, urinalysis, urine culture, lactic acid, CBC with differential, high-sensitivity troponin,  lipase, 1-view chest x-ray, EKG.  Interventions/Medications given:  Medications  lactated ringers bolus 1,000 mL (0 mLs Intravenous Stopped 05/15/23 0309)    (Note:  hospital course my include additional interventions and/or labs/studies not listed above.)   Initiating probable sepsis workup.  Patient is hypotensive, tachycardic, and tachypneic.  However, I received word from EMS that the patient's son has some paperwork and is on his way and that there is a good chance he will not want to proceed with any aggressive treatment.  I will start with a fluid bolus and lab work for additional assessment and a better discussion with him about goals of care.  The patient is on the cardiac monitor to evaluate for evidence of arrhythmia and/or significant heart rate changes.   Clinical Course as of 05/15/23 1610  Sun May 15, 2023  0230 DG Chest [place] [address] I viewed and interpreted the patient's chest x-ray and I am concerned about pneumonia developing on the left side.  Radiologist agrees probable infection. [CF]  0234 Lactic Acid, Venous(!!): 8.8 [CF]  0314 Patient is lab work is grossly abnormal indicating multisystem organ failure and septic shock.  She is in renal failure, has a BNP of 1800, and INR of 7 without taking Coumadin, elevated LFTs, and anion gap of 22, urinary tract infection, etc.  However, her son is at bedside.  He is the healthcare power of attorney along with his brother.  They have documentation that she provided years ago indicating she would not want to be kept alive artificially if she is thought to be incurable or at the end stages of her life.  I talked very candidly  with the patient's son.  Given that she has what appears to be pneumonia on chest x-ray, urinary tract infection, and multisystem organ failure with what appears to be septic shock, the probability of meaningful improvement with or without aggressive intervention is minimal.the current plan is to admit to   The son is very comfortable with the plan for converting to full comfort care and withholding any additional treatment.  She is already a hospice patient.  [facility] for comfort care until placement at a hospice facility can be managed.  I filled out a [facility] DNR order form and put in the appropriate comfort care DNR order in the computer.  I think this is very reasonable and appropriate and in accordance with my understanding of the patient's wishes [CF]  [phone] Consulted with Dr. Arville Care with the hospitalist service who will admit the patient [CF]  [phone] After consulting with Dr. Arville Care for admission for palliative care, the patient's son contacted the hospice providers.  See the nursing note for Loraine Grip (ED charge RN), but in short, Stanford Breed was able to arrange for discharge directly to [facility] hospice care and avoid an unnecessary hospitalization.  I am discharging the patient to that facility. [CF]    Clinical Course User Index [CF] Loleta Rose, MD     FINAL CLINICAL IMPRESSION(S) / ED DIAGNOSES   Final diagnoses:  Septic shock (HCC)  Urinary tract infection without hematuria, site unspecified  Pneumonia due to infectious organism, unspecified laterality, unspecified part of lung  Acute respiratory failure with hypoxia (HCC)  Supratherapeutic INR  Acute liver failure without hepatic coma  Acute renal failure, unspecified acute renal failure type (HCC)  Lactic acidosis  Palliative care status     Rx / DC Orders   ED Discharge Orders     None  Note:  This document was prepared using Dragon voice recognition software and may include unintentional dictation errors.    Loleta Rose, MD 05/15/23 [phone]

## 2023-05-15 NOTE — ED Triage Notes (Addendum)
 Pt to ED via [facility] from [facility]. Staff went to check on pt and found her SOB, only responsive to verbal or painful stimuli, and extremities were blue. Pt was sitting in front of air conditioner but per facility pt sits there all the time. Pt is disoriented and hallucinating, neither of which are their baseline.   Pt is on hospice as of yesterday.   Pt's 02 for facility was 74% on 3L then 83% on 6L.   Vitals for EMS  Co2 17-24 HR 115-130 BP 89/63

## 2023-05-15 NOTE — ED Notes (Signed)
 Son is at bedside with paperwork about pt's wishes for DNR and end of life care. Dr. York Cerise made aware and stated he will speak with him.

## 2023-05-15 NOTE — ED Notes (Signed)
 CCMD called for cardiac monitoring.

## 2023-05-15 NOTE — ED Notes (Signed)
 Dr. York Cerise at bedside talking with pt's son about plan of care.

## 2023-05-15 NOTE — ED Notes (Signed)
 This RN updated pt's family about the transfer to [facility].

## 2023-05-15 NOTE — ED Notes (Signed)
 This RN spoke with Steward Drone, RN at [facility], and gave report.

## 2023-05-20 LAB — CULTURE, BLOOD (ROUTINE X 2)
Culture: NO GROWTH
Culture: NO GROWTH
Special Requests: ADEQUATE

## 2023-06-09 DEATH — deceased

## 2023-06-28 ENCOUNTER — Encounter (INDEPENDENT_AMBULATORY_CARE_PROVIDER_SITE_OTHER): Payer: Self-pay

## 2023-08-09 ENCOUNTER — Ambulatory Visit (INDEPENDENT_AMBULATORY_CARE_PROVIDER_SITE_OTHER): Admitting: Vascular Surgery

## 2023-08-09 ENCOUNTER — Encounter (INDEPENDENT_AMBULATORY_CARE_PROVIDER_SITE_OTHER)
